# Patient Record
Sex: Female | Born: 1985
Health system: Southern US, Community
[De-identification: ages and names within clinical notes are randomized; demographics above are authoritative.]

## PROBLEM LIST (undated history)

## (undated) DIAGNOSIS — M5136 Other intervertebral disc degeneration, lumbar region: Secondary | ICD-10-CM

## (undated) DIAGNOSIS — F909 Attention-deficit hyperactivity disorder, unspecified type: Secondary | ICD-10-CM

## (undated) DIAGNOSIS — M459 Ankylosing spondylitis of unspecified sites in spine: Secondary | ICD-10-CM

## (undated) DIAGNOSIS — Z720 Tobacco use: Secondary | ICD-10-CM

## (undated) DIAGNOSIS — M51369 Other intervertebral disc degeneration, lumbar region without mention of lumbar back pain or lower extremity pain: Secondary | ICD-10-CM

## (undated) DIAGNOSIS — N390 Urinary tract infection, site not specified: Secondary | ICD-10-CM

## (undated) DIAGNOSIS — M1990 Unspecified osteoarthritis, unspecified site: Secondary | ICD-10-CM

## (undated) DIAGNOSIS — G8929 Other chronic pain: Secondary | ICD-10-CM

## (undated) HISTORY — DX: Tobacco use: Z72.0

## (undated) HISTORY — DX: Ankylosing spondylitis of unspecified sites in spine: M45.9

## (undated) HISTORY — DX: Unspecified osteoarthritis, unspecified site: M19.90

## (undated) HISTORY — DX: Other chronic pain: G89.29

## (undated) HISTORY — DX: Urinary tract infection, site not specified: N39.0

## (undated) HISTORY — PX: RADIOFREQUENCY ABLATION NERVES: SUR1070

## (undated) HISTORY — PX: TONSILLECTOMY: SUR1361

## (undated) HISTORY — DX: Attention-deficit hyperactivity disorder, unspecified type: F90.9

---
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
## 2007-01-12 ENCOUNTER — Encounter: Payer: Self-pay | Admitting: Family Medicine

## 2007-01-12 DIAGNOSIS — G43909 Migraine, unspecified, not intractable, without status migrainosus: Secondary | ICD-10-CM | POA: Insufficient documentation

## 2007-01-12 DIAGNOSIS — F329 Major depressive disorder, single episode, unspecified: Secondary | ICD-10-CM

## 2007-01-12 DIAGNOSIS — F909 Attention-deficit hyperactivity disorder, unspecified type: Secondary | ICD-10-CM | POA: Insufficient documentation

## 2007-01-12 HISTORY — DX: Migraine, unspecified, not intractable, without status migrainosus: G43.909

## 2007-02-03 ENCOUNTER — Encounter (INDEPENDENT_AMBULATORY_CARE_PROVIDER_SITE_OTHER): Payer: Self-pay | Admitting: *Deleted

## 2008-03-12 ENCOUNTER — Observation Stay: Payer: Self-pay | Admitting: Obstetrics and Gynecology

## 2008-03-21 ENCOUNTER — Observation Stay: Payer: Self-pay

## 2008-05-26 ENCOUNTER — Observation Stay: Payer: Self-pay | Admitting: Obstetrics and Gynecology

## 2008-06-02 ENCOUNTER — Inpatient Hospital Stay: Payer: Self-pay

## 2008-10-26 ENCOUNTER — Emergency Department (HOSPITAL_COMMUNITY): Admission: EM | Admit: 2008-10-26 | Discharge: 2008-10-26 | Payer: Self-pay | Admitting: Emergency Medicine

## 2010-05-17 ENCOUNTER — Emergency Department: Payer: Self-pay | Admitting: Emergency Medicine

## 2010-09-24 LAB — URINE MICROSCOPIC-ADD ON

## 2010-09-24 LAB — GC/CHLAMYDIA PROBE AMP, GENITAL
Chlamydia, DNA Probe: NEGATIVE
GC Probe Amp, Genital: NEGATIVE

## 2010-09-24 LAB — URINALYSIS, ROUTINE W REFLEX MICROSCOPIC
Bilirubin Urine: NEGATIVE
Glucose, UA: NEGATIVE mg/dL
Hgb urine dipstick: NEGATIVE
Specific Gravity, Urine: 1.023 (ref 1.005–1.030)
pH: 6.5 (ref 5.0–8.0)

## 2010-09-24 LAB — WET PREP, GENITAL: Yeast Wet Prep HPF POC: NONE SEEN

## 2010-09-24 LAB — RPR: RPR Ser Ql: NONREACTIVE

## 2010-09-24 LAB — HCG, QUANTITATIVE, PREGNANCY: hCG, Beta Chain, Quant, S: <2 m[IU]/mL (ref <5)

## 2015-12-05 ENCOUNTER — Other Ambulatory Visit: Payer: Self-pay | Admitting: Orthopedic Surgery

## 2015-12-05 DIAGNOSIS — M2392 Unspecified internal derangement of left knee: Secondary | ICD-10-CM

## 2015-12-05 DIAGNOSIS — M25562 Pain in left knee: Secondary | ICD-10-CM

## 2015-12-25 ENCOUNTER — Ambulatory Visit
Admission: RE | Admit: 2015-12-25 | Discharge: 2015-12-25 | Disposition: A | Discharge status: Home / Self Care | Source: Ambulatory Visit | Attending: Orthopedic Surgery | Admitting: Orthopedic Surgery

## 2015-12-25 DIAGNOSIS — R937 Abnormal findings on diagnostic imaging of other parts of musculoskeletal system: Secondary | ICD-10-CM | POA: Insufficient documentation

## 2015-12-25 DIAGNOSIS — M25462 Effusion, left knee: Secondary | ICD-10-CM | POA: Insufficient documentation

## 2015-12-25 DIAGNOSIS — M2392 Unspecified internal derangement of left knee: Secondary | ICD-10-CM | POA: Diagnosis present

## 2015-12-25 DIAGNOSIS — M25562 Pain in left knee: Secondary | ICD-10-CM | POA: Insufficient documentation

## 2016-01-10 ENCOUNTER — Emergency Department (HOSPITAL_COMMUNITY)
Admission: EM | Admit: 2016-01-10 | Discharge: 2016-01-10 | Disposition: A | Discharge status: Home / Self Care | Attending: Emergency Medicine | Admitting: Emergency Medicine

## 2016-01-10 ENCOUNTER — Encounter (HOSPITAL_COMMUNITY): Payer: Self-pay | Admitting: Emergency Medicine

## 2016-01-10 ENCOUNTER — Emergency Department (HOSPITAL_COMMUNITY)

## 2016-01-10 DIAGNOSIS — S99911A Unspecified injury of right ankle, initial encounter: Secondary | ICD-10-CM | POA: Diagnosis present

## 2016-01-10 DIAGNOSIS — S62309A Unspecified fracture of unspecified metacarpal bone, initial encounter for closed fracture: Secondary | ICD-10-CM

## 2016-01-10 DIAGNOSIS — Y999 Unspecified external cause status: Secondary | ICD-10-CM | POA: Insufficient documentation

## 2016-01-10 DIAGNOSIS — S62310A Displaced fracture of base of second metacarpal bone, right hand, initial encounter for closed fracture: Secondary | ICD-10-CM | POA: Insufficient documentation

## 2016-01-10 DIAGNOSIS — Y939 Activity, unspecified: Secondary | ICD-10-CM | POA: Insufficient documentation

## 2016-01-10 DIAGNOSIS — Z91040 Latex allergy status: Secondary | ICD-10-CM | POA: Insufficient documentation

## 2016-01-10 DIAGNOSIS — Y92410 Unspecified street and highway as the place of occurrence of the external cause: Secondary | ICD-10-CM | POA: Insufficient documentation

## 2016-01-10 DIAGNOSIS — S8251XA Displaced fracture of medial malleolus of right tibia, initial encounter for closed fracture: Secondary | ICD-10-CM | POA: Diagnosis not present

## 2016-01-10 HISTORY — DX: Other intervertebral disc degeneration, lumbar region: M51.36

## 2016-01-10 HISTORY — DX: Other intervertebral disc degeneration, lumbar region without mention of lumbar back pain or lower extremity pain: M51.369

## 2016-01-10 MED ORDER — MORPHINE SULFATE (PF) 4 MG/ML IV SOLN
8.0000 mg | Freq: Once | INTRAVENOUS | Status: AC
  Administered 2016-01-10: 8 mg via INTRAVENOUS
  Filled 2016-01-10: qty 2

## 2016-01-10 MED ORDER — FREE SPIRIT KNEE/LEG WALKER MISC
1.0000 [IU] | 0 refills | Status: DC | PRN
Start: 2016-01-10 — End: 2019-09-28

## 2016-01-10 MED ORDER — OXYCODONE-ACETAMINOPHEN 5-325 MG PO TABS
2.0000 | ORAL_TABLET | Freq: Once | ORAL | Status: AC
  Administered 2016-01-10: 2 via ORAL
  Filled 2016-01-10: qty 2

## 2016-01-10 MED ORDER — HYDROMORPHONE HCL 1 MG/ML IJ SOLN
1.0000 mg | Freq: Once | INTRAMUSCULAR | Status: AC
  Administered 2016-01-10: 1 mg via INTRAVENOUS
  Filled 2016-01-10: qty 1

## 2016-01-10 MED ORDER — OXYCODONE-ACETAMINOPHEN 5-325 MG PO TABS: 1.0000 | ORAL_TABLET | Freq: Four times a day (QID) | ORAL | 0 refills | Status: DC | PRN

## 2016-01-10 NOTE — ED Notes (Signed)
Patient unable to use bed pain at this time due to pain.

## 2016-01-10 NOTE — Progress Notes (Signed)
Orthopedic Tech Progress Note Patient Details:  Julia Mckay [facility] 08/01/1985 [id number]  Ortho Devices Type of Ortho Device: Ace wrap, Crutches, Arm sling, Stirrup splint, Volar splint, Post (short) splint Splint Material: Fiberglass Ortho Device/Splint Interventions: Application   Saul Fordyce 01/10/2016, 10:15 PM

## 2016-01-10 NOTE — ED Notes (Signed)
RN called ortho tech to apply splints
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
## 2016-01-10 NOTE — ED Notes (Signed)
Ortho tech at bedside 

## 2016-01-10 NOTE — ED Notes (Addendum)
Patient has what looks like a softball size bruise below right deltoid that is painful. Patient "needs to use restroom but unable to until she gets more pain medication."

## 2016-01-11 ENCOUNTER — Ambulatory Visit
Admission: RE | Admit: 2016-01-11 | Discharge: 2016-01-11 | Disposition: A | Discharge status: Home / Self Care | Source: Ambulatory Visit | Attending: Surgery | Admitting: Surgery

## 2016-01-11 ENCOUNTER — Other Ambulatory Visit: Payer: Self-pay | Admitting: Surgery

## 2016-01-11 DIAGNOSIS — X58XXXA Exposure to other specified factors, initial encounter: Secondary | ICD-10-CM | POA: Insufficient documentation

## 2016-01-11 DIAGNOSIS — S62311A Displaced fracture of base of second metacarpal bone. left hand, initial encounter for closed fracture: Secondary | ICD-10-CM | POA: Insufficient documentation

## 2016-01-11 DIAGNOSIS — S62319A Displaced fracture of base of unspecified metacarpal bone, initial encounter for closed fracture: Secondary | ICD-10-CM | POA: Insufficient documentation

## 2016-01-14 ENCOUNTER — Encounter: Payer: Self-pay | Admitting: *Deleted

## 2016-01-16 ENCOUNTER — Encounter
Admission: RE | Disposition: A | Discharge status: Home / Self Care | Payer: Self-pay | Source: Ambulatory Visit | Attending: Surgery

## 2016-01-16 ENCOUNTER — Ambulatory Visit: Admitting: Anesthesiology

## 2016-01-16 ENCOUNTER — Ambulatory Visit
Admission: RE | Admit: 2016-01-16 | Discharge: 2016-01-16 | Disposition: A | Discharge status: Home / Self Care | Source: Ambulatory Visit | Attending: Surgery | Admitting: Surgery

## 2016-01-16 DIAGNOSIS — F988 Other specified behavioral and emotional disorders with onset usually occurring in childhood and adolescence: Secondary | ICD-10-CM | POA: Diagnosis not present

## 2016-01-16 DIAGNOSIS — R51 Headache: Secondary | ICD-10-CM | POA: Diagnosis not present

## 2016-01-16 DIAGNOSIS — Z888 Allergy status to other drugs, medicaments and biological substances status: Secondary | ICD-10-CM | POA: Insufficient documentation

## 2016-01-16 DIAGNOSIS — F329 Major depressive disorder, single episode, unspecified: Secondary | ICD-10-CM | POA: Diagnosis not present

## 2016-01-16 DIAGNOSIS — S8251XA Displaced fracture of medial malleolus of right tibia, initial encounter for closed fracture: Secondary | ICD-10-CM | POA: Diagnosis present

## 2016-01-16 DIAGNOSIS — Z79899 Other long term (current) drug therapy: Secondary | ICD-10-CM | POA: Diagnosis not present

## 2016-01-16 DIAGNOSIS — S62314A Displaced fracture of base of fourth metacarpal bone, right hand, initial encounter for closed fracture: Secondary | ICD-10-CM | POA: Insufficient documentation

## 2016-01-16 DIAGNOSIS — Z91040 Latex allergy status: Secondary | ICD-10-CM | POA: Diagnosis not present

## 2016-01-16 DIAGNOSIS — S62310A Displaced fracture of base of second metacarpal bone, right hand, initial encounter for closed fracture: Secondary | ICD-10-CM | POA: Diagnosis not present

## 2016-01-16 DIAGNOSIS — M1990 Unspecified osteoarthritis, unspecified site: Secondary | ICD-10-CM | POA: Diagnosis not present

## 2016-01-16 DIAGNOSIS — Y939 Activity, unspecified: Secondary | ICD-10-CM | POA: Diagnosis not present

## 2016-01-16 HISTORY — PX: ORIF ANKLE FRACTURE: SHX5408

## 2016-01-16 HISTORY — PX: CLOSED REDUCTION METACARPAL WITH PERCUTANEOUS PINNING: SHX5613

## 2016-01-16 SURGERY — CLOSED REDUCTION, FRACTURE, METACARPAL BONE, WITH PERCUTANEOUS PINNING
Anesthesia: General | Site: Ankle | Laterality: Right | Wound class: Clean

## 2016-01-16 MED ORDER — HYDROCODONE-ACETAMINOPHEN 5-325 MG PO TABS: 1.0000 | ORAL_TABLET | ORAL | Status: DC | PRN

## 2016-01-16 MED ORDER — PROMETHAZINE HCL 25 MG/ML IJ SOLN: 6.2500 mg | INTRAMUSCULAR | Status: DC | PRN

## 2016-01-16 MED ORDER — LIDOCAINE HCL (CARDIAC) 20 MG/ML IV SOLN
INTRAVENOUS | Status: DC | PRN
  Administered 2016-01-16: 50 mg via INTRATRACHEAL

## 2016-01-16 MED ORDER — ROPIVACAINE HCL 5 MG/ML IJ SOLN
INTRAMUSCULAR | Status: DC | PRN
  Administered 2016-01-16: 100 mg via PERINEURAL

## 2016-01-16 MED ORDER — POTASSIUM CHLORIDE IN NACL 20-0.9 MEQ/L-% IV SOLN
INTRAVENOUS | Status: DC
Start: 2016-01-16 — End: 2016-01-16

## 2016-01-16 MED ORDER — ACETAMINOPHEN 10 MG/ML IV SOLN
INTRAVENOUS | Status: DC | PRN
  Administered 2016-01-16: 1000 mg via INTRAVENOUS

## 2016-01-16 MED ORDER — OXYCODONE HCL 5 MG PO TABS
5.0000 mg | ORAL_TABLET | Freq: Once | ORAL | Status: AC | PRN
  Administered 2016-01-16: 5 mg via ORAL

## 2016-01-16 MED ORDER — METOCLOPRAMIDE HCL 5 MG/ML IJ SOLN: 5.0000 mg | Freq: Three times a day (TID) | INTRAMUSCULAR | Status: DC | PRN

## 2016-01-16 MED ORDER — OXYCODONE HCL 5 MG/5ML PO SOLN: 5.0000 mg | Freq: Once | ORAL | Status: AC | PRN

## 2016-01-16 MED ORDER — CEFAZOLIN SODIUM-DEXTROSE 2-4 GM/100ML-% IV SOLN
2.0000 g | Freq: Once | INTRAVENOUS | Status: AC
  Administered 2016-01-16: 2 g via INTRAVENOUS

## 2016-01-16 MED ORDER — KETOROLAC TROMETHAMINE 30 MG/ML IJ SOLN
30.0000 mg | Freq: Once | INTRAMUSCULAR | Status: AC
  Administered 2016-01-16: 30 mg via INTRAVENOUS

## 2016-01-16 MED ORDER — ONDANSETRON HCL 4 MG PO TABS: 4.0000 mg | ORAL_TABLET | Freq: Four times a day (QID) | ORAL | Status: DC | PRN

## 2016-01-16 MED ORDER — BUPIVACAINE-EPINEPHRINE (PF) 0.5% -1:200000 IJ SOLN
INTRAMUSCULAR | Status: DC | PRN
  Administered 2016-01-16: 10 mL via PERINEURAL

## 2016-01-16 MED ORDER — DEXAMETHASONE SODIUM PHOSPHATE 4 MG/ML IJ SOLN
INTRAMUSCULAR | Status: DC | PRN
  Administered 2016-01-16: 4 mg via INTRAVENOUS

## 2016-01-16 MED ORDER — METOCLOPRAMIDE HCL 5 MG PO TABS: 5.0000 mg | ORAL_TABLET | Freq: Three times a day (TID) | ORAL | Status: DC | PRN

## 2016-01-16 MED ORDER — GLYCOPYRROLATE 0.2 MG/ML IJ SOLN
INTRAMUSCULAR | Status: DC | PRN
  Administered 2016-01-16: 0.1 mg via INTRAVENOUS

## 2016-01-16 MED ORDER — ONDANSETRON HCL 4 MG/2ML IJ SOLN: 4.0000 mg | Freq: Four times a day (QID) | INTRAMUSCULAR | Status: DC | PRN

## 2016-01-16 MED ORDER — HYDROCODONE-ACETAMINOPHEN 5-325 MG PO TABS: 1.0000 | ORAL_TABLET | Freq: Four times a day (QID) | ORAL | 0 refills | Status: DC | PRN

## 2016-01-16 MED ORDER — LACTATED RINGERS IV SOLN
INTRAVENOUS | Status: DC
  Administered 2016-01-16 (×2): via INTRAVENOUS

## 2016-01-16 MED ORDER — FENTANYL CITRATE (PF) 100 MCG/2ML IJ SOLN
50.0000 ug | INTRAMUSCULAR | Status: AC | PRN
  Administered 2016-01-16 (×2): 50 ug via INTRAVENOUS
  Administered 2016-01-16: 25 ug via INTRAVENOUS
  Administered 2016-01-16: 50 ug via INTRAVENOUS
  Administered 2016-01-16 (×5): 25 ug via INTRAVENOUS
  Administered 2016-01-16 (×2): 50 ug via INTRAVENOUS

## 2016-01-16 MED ORDER — ONDANSETRON HCL 4 MG/2ML IJ SOLN
INTRAMUSCULAR | Status: DC | PRN
  Administered 2016-01-16: 4 mg via INTRAVENOUS

## 2016-01-16 MED ORDER — PROPOFOL 10 MG/ML IV BOLUS
INTRAVENOUS | Status: DC | PRN
  Administered 2016-01-16: 200 mg via INTRAVENOUS

## 2016-01-16 MED ORDER — ONDANSETRON 4 MG PO TBDP: 4.0000 mg | ORAL_TABLET | Freq: Three times a day (TID) | ORAL | 1 refills | Status: DC | PRN

## 2016-01-16 MED ORDER — MIDAZOLAM HCL 5 MG/5ML IJ SOLN
INTRAMUSCULAR | Status: DC | PRN
  Administered 2016-01-16: 2 mg via INTRAVENOUS

## 2016-01-16 MED ORDER — HYDROMORPHONE HCL 1 MG/ML IJ SOLN
0.2500 mg | INTRAMUSCULAR | Status: DC | PRN
  Administered 2016-01-16 (×2): 0.5 mg via INTRAVENOUS

## 2016-01-16 MED ORDER — HYDROMORPHONE HCL 1 MG/ML IJ SOLN
INTRAMUSCULAR | Status: DC | PRN
  Administered 2016-01-16 (×2): 0.5 mg via INTRAVENOUS

## 2016-01-16 SURGICAL SUPPLY — 57 items
BANDAGE ACE 4X5 VEL STRL LF (GAUZE/BANDAGES/DRESSINGS) ×8 IMPLANT
BANDAGE ACE 6X5 VEL STRL LF (GAUZE/BANDAGES/DRESSINGS) ×4 IMPLANT
BLADE SURG 15 STRL LF DISP TIS (BLADE) ×2 IMPLANT
BLADE SURG 15 STRL SS (BLADE) ×4
BLADE SURG SZ10 CARB STEEL (BLADE) ×8 IMPLANT
BNDG COHESIVE 4X5 TAN STRL (GAUZE/BANDAGES/DRESSINGS) ×4 IMPLANT
BNDG ESMARK 6X12 TAN STRL LF (GAUZE/BANDAGES/DRESSINGS) ×4 IMPLANT
BNDG PLASTER FAST 4X5 WHT LF (CAST SUPPLIES) ×16 IMPLANT
BNDG PLSTR 5X4 FST ST WHT LF (CAST SUPPLIES) ×8
CANISTER SUCT 1200ML W/VALVE (MISCELLANEOUS) ×4 IMPLANT
CHLORAPREP W/TINT 26ML (MISCELLANEOUS) ×8 IMPLANT
COVER LIGHT HANDLE UNIVERSAL (MISCELLANEOUS) ×8 IMPLANT
COVER PIN YLW 0.028-062 (MISCELLANEOUS) ×6 IMPLANT
CUFF TOURN SGL QUICK 18 (TOURNIQUET CUFF) ×2 IMPLANT
CUFF TOURN SGL QUICK 24 (TOURNIQUET CUFF) ×4
CUFF TOURN SGL QUICK 34 (TOURNIQUET CUFF)
CUFF TRNQT CYL 24X4X40X1 (TOURNIQUET CUFF) IMPLANT
CUFF TRNQT CYL 34X4X40X1 (TOURNIQUET CUFF) IMPLANT
DRAPE C-ARM XRAY 36X54 (DRAPES) ×4 IMPLANT
DRAPE INCISE IOBAN 66X45 STRL (DRAPES) ×4 IMPLANT
DRAPE SURG 17X11 SM STRL (DRAPES) ×4 IMPLANT
DRAPE U-SHAPE 48X52 POLY STRL (PACKS) ×4 IMPLANT
ELECT CAUTERY BLADE TIP 2.5 (TIP)
ELECT REM PT RETURN 9FT ADLT (ELECTROSURGICAL) ×4
ELECTRODE CAUTERY BLDE TIP 2.5 (TIP) ×2 IMPLANT
ELECTRODE REM PT RTRN 9FT ADLT (ELECTROSURGICAL) ×2 IMPLANT
GAUZE PETRO XEROFOAM 1X8 (MISCELLANEOUS) ×4 IMPLANT
GAUZE SPONGE 4X4 12PLY STRL (GAUZE/BANDAGES/DRESSINGS) ×4 IMPLANT
GLOVE PI ULTRA LF STRL 7.5 (GLOVE) IMPLANT
GLOVE PI ULTRA NON LATEX 7.5 (GLOVE) ×12
GOWN STRL REUS W/ TWL LRG LVL3 (GOWN DISPOSABLE) ×2 IMPLANT
GOWN STRL REUS W/ TWL XL LVL3 (GOWN DISPOSABLE) ×2 IMPLANT
GOWN STRL REUS W/TWL LRG LVL3 (GOWN DISPOSABLE) ×8
GOWN STRL REUS W/TWL XL LVL3 (GOWN DISPOSABLE) ×4
K-WIRE ACE 1.6X6 (WIRE) ×12
KIT ROOM TURNOVER OR (KITS) ×4 IMPLANT
KWIRE ACE 1.6X6 (WIRE) IMPLANT
NDL HYPO 21X1.5 SAFETY (NEEDLE) ×2 IMPLANT
NEEDLE HYPO 21X1.5 SAFETY (NEEDLE) ×4 IMPLANT
NS IRRIG 1000ML POUR BTL (IV SOLUTION) ×4 IMPLANT
PACK EXTREMITY ARMC (MISCELLANEOUS) ×4 IMPLANT
PAD ABD DERMACEA PRESS 5X9 (GAUZE/BANDAGES/DRESSINGS) ×10 IMPLANT
PAD CAST CTTN 4X4 STRL (SOFTGOODS) ×4 IMPLANT
PADDING CAST 4IN STRL (MISCELLANEOUS) ×6
PADDING CAST BLEND 4X4 STRL (MISCELLANEOUS) IMPLANT
PADDING CAST COTTON 4X4 STRL (SOFTGOODS) ×8
SCREW ACE CAN 4.0 46M (Screw) ×4 IMPLANT
SPONGE LAP 18X18 5 PK (GAUZE/BANDAGES/DRESSINGS) ×4 IMPLANT
STAPLER SKIN PROX 35W (STAPLE) ×4 IMPLANT
STOCKINETTE IMPERVIOUS LG (DRAPES) ×4 IMPLANT
STRAP BODY AND KNEE 60X3 (MISCELLANEOUS) ×4 IMPLANT
SUT VIC AB 0 CT1 36 (SUTURE) ×4 IMPLANT
SUT VIC AB 2-0 CT1 27 (SUTURE) ×8
SUT VIC AB 2-0 CT1 TAPERPNT 27 (SUTURE) IMPLANT
SUT VIC AB 2-0 SH 27 (SUTURE) ×8
SUT VIC AB 2-0 SH 27XBRD (SUTURE) ×4 IMPLANT
k-wire (Wire) ×8 IMPLANT

## 2016-01-16 NOTE — Transfer of Care (Signed)
Immediate Anesthesia Transfer of Care Note  Patient: Julia Mckay  Procedure(s) Performed: Procedure(s) with comments: CLOSED REDUCTION METACARPAL WITH PERCUTANEOUS PINNING 1 OR 2 METACARPAL FRACTURES OF RIGHT HAND (Right) OPEN REDUCTION INTERNAL FIXATION (ORIF) ANKLE FRACTURE (Right) - Latex allergy  Patient Location: PACU  Anesthesia Type: General LMA  Level of Consciousness: awake, alert  and patient cooperative  Airway and Oxygen Therapy: Patient Spontanous Breathing and Patient connected to supplemental oxygen  Post-op Assessment: Post-op Vital signs reviewed, Patient's Cardiovascular Status Stable, Respiratory Function Stable, Patent Airway and No signs of Nausea or vomiting  Post-op Vital Signs: Reviewed and stable  Complications: No apparent anesthesia complications

## 2016-01-16 NOTE — Progress Notes (Signed)
Assisted [place] ANMD with right, interscalene  block. Side rails up, monitors on throughout procedure. See vital signs in flow sheet. Tolerated Procedure well.   
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
## 2016-01-16 NOTE — H&P (Signed)
Paper H&P to be scanned into permanent record. H&P reviewed. No changes. 

## 2016-01-16 NOTE — Discharge Instructions (Signed)
General Anesthesia, Adult, Care After Refer to this sheet in the next few weeks. These instructions provide you with information on caring for yourself after your procedure. Your health care provider may also give you more specific instructions. Your treatment has been planned according to current medical practices, but problems sometimes occur. Call your health care provider if you have any problems or questions after your procedure. WHAT TO EXPECT AFTER THE PROCEDURE After the procedure, it is typical to experience:  Sleepiness.  Nausea and vomiting. HOME CARE INSTRUCTIONS  For the first 24 hours after general anesthesia:  Have a responsible person with you.  Do not drive a car. If you are alone, do not take public transportation.  Do not drink alcohol.  Do not take medicine that has not been prescribed by your health care provider.  Do not sign important papers or make important decisions.  You may resume a normal diet and activities as directed by your health care provider.  Change bandages (dressings) as directed.  If you have questions or problems that seem related to general anesthesia, call the hospital and ask for the anesthetist or anesthesiologist on call. SEEK MEDICAL CARE IF:  You have nausea and vomiting that continue the day after anesthesia.  You develop a rash. SEEK IMMEDIATE MEDICAL CARE IF:   You have difficulty breathing.  You have chest pain.  You have any allergic problems.   This information is not intended to replace advice given to you by your health care provider. Make sure you discuss any questions you have with your health care provider.   Document Released: 09/08/2000 Document Revised: 06/23/2014 Document Reviewed: 10/01/2011 Elsevier Interactive Patient Education 2016 [employer].  Keep splints dry and intact. Keep right hand and right foot elevated above heart level. Apply ice to affected areas frequently. Return for follow-up in 10-14  days or as scheduled.

## 2016-01-16 NOTE — Anesthesia Postprocedure Evaluation (Signed)
Anesthesia Post Note  Patient: Julia Mckay  Procedure(s) Performed: Procedure(s) (LRB): CLOSED REDUCTION METACARPAL WITH PERCUTANEOUS PINNING 1 and  2 METACARPAL FRACTURES OF RIGHT HAND (Right) OPEN REDUCTION INTERNAL FIXATION (ORIF) ANKLE FRACTURE (Right)  Patient location during evaluation: PACU Anesthesia Type: General and Regional Level of consciousness: awake and alert Pain management: pain level controlled Vital Signs Assessment: post-procedure vital signs reviewed and stable Respiratory status: spontaneous breathing, nonlabored ventilation, respiratory function stable and patient connected to nasal cannula oxygen Cardiovascular status: blood pressure returned to baseline and stable Postop Assessment: no signs of nausea or vomiting Anesthetic complications: no Comments:      Scarlette Slice

## 2016-01-16 NOTE — Anesthesia Procedure Notes (Signed)
Anesthesia Regional Block:  Supraclavicular block  Pre-Anesthetic Checklist: ,, timeout performed, Correct Patient, Correct Site, Correct Laterality, Correct Procedure, Correct Position, site marked, Risks and benefits discussed,  Surgical consent,  Pre-op evaluation,  At surgeon's request and post-op pain management  Laterality: Right  Prep: chloraprep       Needles:  Injection technique: Single-shot  Needle Type: Echogenic Stimulator Needle      Needle Gauge: 21 and 21 G    Additional Needles:  Procedures: ultrasound guided (picture in chart) and nerve stimulator Supraclavicular block  Nerve Stimulator or Paresthesia:  Response: bicep contraction, 0.45 mA,   Additional Responses:   Narrative:  Injection made incrementally with aspirations every 5 mL.  Performed by: Personally   Additional Notes: Functioning IV was confirmed and monitors applied.  Sterile prep and drape,hand hygiene and sterile gloves were used.Ultrasound guidance: relevant anatomy identified, needle position confirmed, local anesthetic spread visualized around nerve(s)., vascular puncture avoided.  Image printed for medical record.  Negative aspiration and negative test dose prior to incremental administration of local anesthetic. The patient tolerated the procedure well. Vitals signes recorded in RN notes.      

## 2016-01-16 NOTE — Anesthesia Procedure Notes (Signed)
Procedure Name: LMA Insertion Date/Time: 01/16/2016 10:34 AM Performed by: Orlin Hilding Pre-anesthesia Checklist: Patient identified, Emergency Drugs available, Suction available, Timeout performed and Patient being monitored Patient Re-evaluated:Patient Re-evaluated prior to inductionOxygen Delivery Method: Circle system utilized Preoxygenation: Pre-oxygenation with 100% oxygen Intubation Type: IV induction LMA: LMA inserted LMA Size: 4.0 Number of attempts: 1 Placement Confirmation: positive ETCO2 and breath sounds checked- equal and bilateral Tube secured with: Tape

## 2016-01-16 NOTE — Anesthesia Preprocedure Evaluation (Addendum)
Anesthesia Evaluation  Patient identified by MRN, date of birth, ID band Patient awake    Reviewed: Allergy & Precautions, H&P , NPO status , Patient's Chart, lab work & pertinent test results, reviewed documented beta blocker date and time   Airway Mallampati: II  TM Distance: >3 FB Neck ROM: full    Dental no notable dental hx.    Pulmonary neg pulmonary ROS, former smoker,    Pulmonary exam normal breath sounds clear to auscultation       Cardiovascular Exercise Tolerance: Good negative cardio ROS   Rhythm:regular Rate:Normal     Neuro/Psych  Headaches, PSYCHIATRIC DISORDERS (depression, ADD)    GI/Hepatic negative GI ROS, Neg liver ROS,   Endo/Other  negative endocrine ROS  Renal/GU negative Renal ROS  negative genitourinary   Musculoskeletal  (+) Arthritis ,   Abdominal   Peds  Hematology negative hematology ROS (+)   Anesthesia Other Findings   Reproductive/Obstetrics negative OB ROS                            Anesthesia Physical Anesthesia Plan  ASA: II  Anesthesia Plan: General LMA   Post-op Pain Management:    Induction:   Airway Management Planned:   Additional Equipment:   Intra-op Plan:   Post-operative Plan:   Informed Consent: I have reviewed the patients History and Physical, chart, labs and discussed the procedure including the risks, benefits and alternatives for the proposed anesthesia with the patient or authorized representative who has indicated his/her understanding and acceptance.   Dental Advisory Given  Plan Discussed with: CRNA  Anesthesia Plan Comments: (Reviewed possibility of post-op regional block if pain not responsive to IV/PO analgesics.)       Anesthesia Quick Evaluation

## 2016-01-17 ENCOUNTER — Encounter: Payer: Self-pay | Admitting: Surgery
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
## 2019-05-25 ENCOUNTER — Other Ambulatory Visit: Payer: Self-pay

## 2019-05-25 DIAGNOSIS — Z20822 Contact with and (suspected) exposure to covid-19: Secondary | ICD-10-CM

## 2019-05-26 LAB — NOVEL CORONAVIRUS, NAA: SARS-CoV-2, NAA: NOT DETECTED

## 2019-09-28 ENCOUNTER — Other Ambulatory Visit: Payer: Self-pay

## 2019-09-28 ENCOUNTER — Ambulatory Visit (INDEPENDENT_AMBULATORY_CARE_PROVIDER_SITE_OTHER): Payer: 59 | Admitting: Primary Care

## 2019-09-28 ENCOUNTER — Encounter: Payer: Self-pay | Admitting: Primary Care

## 2019-09-28 VITALS — BP 136/86 | HR 88 | Temp 97.0°F | Ht 66.0 in | Wt 214.0 lb

## 2019-09-28 DIAGNOSIS — M79673 Pain in unspecified foot: Secondary | ICD-10-CM

## 2019-09-28 DIAGNOSIS — Z720 Tobacco use: Secondary | ICD-10-CM | POA: Diagnosis not present

## 2019-09-28 DIAGNOSIS — M5440 Lumbago with sciatica, unspecified side: Secondary | ICD-10-CM

## 2019-09-28 DIAGNOSIS — G8929 Other chronic pain: Secondary | ICD-10-CM

## 2019-09-28 DIAGNOSIS — F909 Attention-deficit hyperactivity disorder, unspecified type: Secondary | ICD-10-CM

## 2019-09-28 DIAGNOSIS — M2550 Pain in unspecified joint: Secondary | ICD-10-CM

## 2019-09-28 DIAGNOSIS — Z8249 Family history of ischemic heart disease and other diseases of the circulatory system: Secondary | ICD-10-CM

## 2019-09-28 MED ORDER — BUPROPION HCL ER (SR) 100 MG PO TB12: 100.0000 mg | ORAL_TABLET | Freq: Two times a day (BID) | ORAL | 0 refills | Status: DC

## 2019-09-28 NOTE — Assessment & Plan Note (Signed)
Evaluated by rheumatology, tested negative for rheumatoid arthritis/autoimmune disorder.

## 2019-09-28 NOTE — Assessment & Plan Note (Signed)
Managed on Adderall in [place], has been off for quite some time. Discussed that she will need psychiatry evaluation for testing and treatment. We will discuss at another time.

## 2019-09-28 NOTE — Assessment & Plan Note (Signed)
In father with early death at 89 from MI. Encouraged weight loss, tobacco cessation.  Will address again at further visits.

## 2019-09-28 NOTE — Assessment & Plan Note (Signed)
Chronic to bilateral feet, follows with podiatry.

## 2019-09-28 NOTE — Patient Instructions (Addendum)
You will be contacted regarding your referral to physical therapy.  Please let us know if you have not been contacted within two weeks.   Start bupropion SR 100 mg tablets for smoking. Start by taking 1 tablet by mouth once daily for 3-5 days, then increase to 1 tablet twice daily thereafter.  Choose a quit date prior to starting the medication. The quit date should be within the first 1-2 weeks.  Please update me in three weeks.   [facility] [employer] [facility] [place]  It was a pleasure to meet you today! Please don't hesitate to call or message me with any questions. Welcome to [facility]!

## 2019-09-28 NOTE — Progress Notes (Signed)
Subjective:    Patient ID: Julia Mckay, female    DOB: 06-01-1986, 34 y.o.   MRN: [id number]  HPI  This visit occurred during the SARS-CoV-2 public health emergency.  Safety protocols were in place, including screening questions prior to the visit, additional usage of staff PPE, and extensive cleaning of exam room while observing appropriate contact time as indicated for disinfecting solutions.   Julia Mckay is a 34 year old female who presents today to establish care and discuss the problems mentioned below. Will obtain/review records.  1) Rheumatoid Arthritis: Diagnosed years ago in [place], positive labs for RA at the time. Symptoms included chronic hand pain and mild deformity to fingers.   She has been following with rheumatology through [facility] recently, has undergone xrays of SI joint/hands and blood work. All labs and xrays were negative and not suggestive of autoimmune disease. Weight loss was recommended.   2) Chronic Foot Pain: Following with podiatry for chronic foot pain which is located to bilateral feet, mostly to left dorsal foot and to the toes when flexing or extending. She is wearing [facility] and pain has improved slightly. She is also managed on Melxoicam, has recently stopped. Due for follow up soon.   3) Chronic Back Pain: Chronic to midline back pain for years. She underwent MRI of the lumbar spine five years ago in [place] and was diagnosed with lumbar DDD and "slippped discs". She underwent nerve ablations around the same time.   She is currently taking 1000 mg of Ibuprofen twice daily and 15000 mg of Tylenol twice daily everyday, some improvement with pain but not much.. She is also managed on Meloxicam 15 mg for her chronic foot pain, does not take this with Ibuprofen. Once managed on narcotics and had a dependence, able to wean off.   Her back pain has become very bothersome at night, has trouble falling and staying asleep. Cannot get comfortable at  night. She was once on Ambien when she lived in [place], did well. She's also tried Trazodone which wasn't effective, also on amitriptyline in the past, not sure why she came off.    About one year ago she began having intermittent left lower back pain with radiation down her left lower extremity. She denies loss of bowel/bladder control. She's never been through physical therapy. She is working on weight loss.   4) Tobacco Abuse: Smoker of cigarettes for the last 15 years, is currently smoking 10 cigarettes daily. She's tried Vaping, nicotine gum, nicotine patches which were ineffective. She has a strong family history of heart disease, early death in her father at the age of 49 from a heart attack.  Review of Systems  Respiratory: Negative for shortness of breath.   Cardiovascular: Negative for chest pain.  Musculoskeletal: Positive for arthralgias. Negative for joint swelling.  Neurological: Positive for numbness. Negative for weakness.       Past Medical History:  Diagnosis Date  . ADHD   . Ankylosing spondylitis ([facility])   . Arthritis   . Chronic foot pain   . DDD (degenerative disc disease), lumbar   . Tobacco abuse   . UTI (urinary tract infection)      Social History   Socioeconomic History  . Marital status: Legally Separated    Spouse name: Not on file  . Number of children: Not on file  . Years of education: Not on file  . Highest education level: Not on file  Occupational History  . Not  on file  Tobacco Use  . Smoking status: Former Smoker    Packs/day: 1.00    Years: 13.00    Pack years: 13.00    Quit date: 05/17/2015    Years since quitting: 4.3  . Smokeless tobacco: Never Used  Substance and Sexual Activity  . Alcohol use: No  . Drug use: No  . Sexual activity: Not on file  Other Topics Concern  . Not on file  Social History Narrative  . Not on file   Social Determinants of Health   Financial Resource Strain:   . Difficulty of Paying Living Expenses:     Food Insecurity:   . Worried About Programme researcher, broadcasting/film/video in the Last Year:   . Barista in the Last Year:   Transportation Needs:   . Freight forwarder (Medical):   Marland Kitchen Lack of Transportation (Non-Medical):   Physical Activity:   . Days of Exercise per Week:   . Minutes of Exercise per Session:   Stress:   . Feeling of Stress :   Social Connections:   . Frequency of Communication with Friends and Family:   . Frequency of Social Gatherings with Friends and Family:   . Attends Religious Services:   . Active Member of Clubs or Organizations:   . Attends Banker Meetings:   Marland Kitchen Marital Status:   Intimate Partner Violence:   . Fear of Current or Ex-Partner:   . Emotionally Abused:   Marland Kitchen Physically Abused:   . Sexually Abused:     Past Surgical History:  Procedure Laterality Date  . CLOSED REDUCTION METACARPAL WITH PERCUTANEOUS PINNING Right 01/16/2016   Procedure: CLOSED REDUCTION METACARPAL WITH PERCUTANEOUS PINNING 1 and  2 METACARPAL FRACTURES OF RIGHT HAND;  Surgeon: Christena Flake, MD;  Location: [facility];  Service: Orthopedics;  Laterality: Right;  . ORIF ANKLE FRACTURE Right 01/16/2016   Procedure: OPEN REDUCTION INTERNAL FIXATION (ORIF) ANKLE FRACTURE;  Surgeon: Christena Flake, MD;  Location: [facility];  Service: Orthopedics;  Laterality: Right;  Latex allergy  . RADIOFREQUENCY ABLATION NERVES     lumbar region of back  . TONSILLECTOMY      Family History  Problem Relation Age of Onset  . Diabetes Mother   . Hypertension Mother   . Heart attack Father   . Early death Father        107  . Hypertension Father     Allergies  Allergen Reactions  . Drospirenone-Ethinyl Estradiol     REACTION: moodiness  . Latex Itching    Current Outpatient Medications on File Prior to Visit  Medication Sig Dispense Refill  . meloxicam (MOBIC) 15 MG tablet Take 15 mg by mouth daily.     No current facility-administered medications on file prior to  visit.    BP 136/86   Pulse 88   Temp (!) 97 F (36.1 C) (Temporal)   Ht 5\' 6"  (1.676 m)   Wt 214 lb (97.1 kg)   SpO2 98%   BMI 34.54 kg/m    Objective:   Physical Exam  Constitutional: She appears well-nourished.  Cardiovascular: Normal rate and regular rhythm.  Respiratory: Effort normal and breath sounds normal.  Musculoskeletal:     Cervical back: Neck supple.  Skin: Skin is warm and dry.  Psychiatric: She has a normal mood and affect.           Assessment & Plan:

## 2019-09-28 NOTE — Assessment & Plan Note (Signed)
Ready to quit, smoking for the last 15 years. Failed OTC treatment.  Discussed options for treatment, will start with low dose Wellbutrin SR. Discussed to choose quit date prior to starting, discussed side effects.   She will update in 3 weeks.

## 2019-09-28 NOTE — Assessment & Plan Note (Signed)
Chronic for years, once on narcotics.  It is quite surprising that she has not undergone physical therapy for chronic back pain, recommend we start here for treatment.  Referral placed. She will update. Consider spine center if no improvement.

## 2019-10-25 ENCOUNTER — Other Ambulatory Visit: Payer: Self-pay | Admitting: Primary Care

## 2019-10-25 DIAGNOSIS — Z720 Tobacco use: Secondary | ICD-10-CM

## 2019-12-01 ENCOUNTER — Other Ambulatory Visit: Payer: Self-pay | Admitting: Primary Care

## 2019-12-01 DIAGNOSIS — Z720 Tobacco use: Secondary | ICD-10-CM

## 2019-12-02 NOTE — Telephone Encounter (Signed)
Last prescribed on 10/27/2019 Last OV (Establish Care) with Mayra Reel on 09/28/2019 No future OV scheduled

## 2019-12-02 NOTE — Telephone Encounter (Signed)
How is she doing on the bupropion for tobacco use?  Is it helping?

## 2019-12-02 NOTE — Telephone Encounter (Signed)
Message left for patient to return my call.  

## 2019-12-07 NOTE — Telephone Encounter (Signed)
Noted, refill sent to pharmacy. 

## 2019-12-07 NOTE — Telephone Encounter (Signed)
Called patient and she stated that it is helping, smoking less and refill please. Then patient hung of the phone to get more detail.

## 2019-12-09 ENCOUNTER — Ambulatory Visit (HOSPITAL_COMMUNITY)
Admission: EM | Admit: 2019-12-09 | Discharge: 2019-12-09 | Disposition: A | Discharge status: Home / Self Care | Payer: 59

## 2019-12-09 ENCOUNTER — Encounter (HOSPITAL_COMMUNITY): Payer: Self-pay

## 2019-12-09 ENCOUNTER — Other Ambulatory Visit: Payer: Self-pay

## 2019-12-09 DIAGNOSIS — B029 Zoster without complications: Secondary | ICD-10-CM | POA: Diagnosis not present

## 2019-12-09 MED ORDER — TRIAMCINOLONE ACETONIDE 0.5 % EX OINT: 1.0000 "application " | TOPICAL_OINTMENT | Freq: Two times a day (BID) | CUTANEOUS | 0 refills | Status: DC

## 2019-12-09 MED ORDER — HYDROXYZINE HCL 25 MG PO TABS
25.0000 mg | ORAL_TABLET | Freq: Four times a day (QID) | ORAL | 0 refills | Status: DC
Start: 2019-12-09 — End: 2020-02-02

## 2019-12-09 MED ORDER — VALACYCLOVIR HCL 1 G PO TABS
1000.0000 mg | ORAL_TABLET | Freq: Three times a day (TID) | ORAL | 0 refills | Status: DC
Start: 2019-12-09 — End: 2020-02-02

## 2019-12-09 NOTE — ED Provider Notes (Signed)
[facility]    CSN: [id number] Arrival date & time: 12/09/19  1736      History   Chief Complaint Chief Complaint  Patient presents with  . Rash    HPI Julia Mckay is a 34 y.o. female with history of left ear, arthritis, herpes zoster, migraines, tobacco use presenting for painful rash of her left arm.  Patient states she had a tattoo done 2 weeks ago: Recovered well.  No arthralgias, myalgias, fever, deformity of type II.  Patient states it does feel similar to when she had shingles, though is in different distribution of same arm.  (Was axillary previously).   Past Medical History:  Diagnosis Date  . ADHD   . Ankylosing spondylitis (HCC)   . Arthritis   . Chronic foot pain   . DDD (degenerative disc disease), lumbar   . HERPES ZOSTER, UNCOMPLICATED 11/10/2006   Qualifier: Diagnosis of  By: Ermalene Searing MD, Amy    . MIGRAINE HEADACHE 01/12/2007   Qualifier: History of  By: Lindwood Qua CMA, Jerl Santos    . Tobacco abuse   . UTI (urinary tract infection)     Patient Active Problem List   Diagnosis Date Noted  . Chronic midline low back pain with sciatica 09/28/2019  . Tobacco abuse 09/28/2019  . Chronic foot pain 09/28/2019  . Family history of early CAD 09/28/2019  . Arthralgia of multiple sites, bilateral 09/28/2019  . DEPRESSION 01/12/2007  . Attention deficit hyperactivity disorder (ADHD) 01/12/2007    Past Surgical History:  Procedure Laterality Date  . CLOSED REDUCTION METACARPAL WITH PERCUTANEOUS PINNING Right 01/16/2016   Procedure: CLOSED REDUCTION METACARPAL WITH PERCUTANEOUS PINNING 1 and  2 METACARPAL FRACTURES OF RIGHT HAND;  Surgeon: Christena Flake, MD;  Location: [facility];  Service: Orthopedics;  Laterality: Right;  . ORIF ANKLE FRACTURE Right 01/16/2016   Procedure: OPEN REDUCTION INTERNAL FIXATION (ORIF) ANKLE FRACTURE;  Surgeon: Christena Flake, MD;  Location: [facility];  Service: Orthopedics;  Laterality: Right;  Latex allergy  .  RADIOFREQUENCY ABLATION NERVES     lumbar region of back  . TONSILLECTOMY      OB History   No obstetric history on file.      Home Medications    Prior to Admission medications   Medication Sig Start Date End Date Taking? Authorizing Provider  acetaminophen (TYLENOL) 500 MG tablet Take 500 mg by mouth every 6 (six) hours as needed.   Yes [provider]  ibuprofen (ADVIL) 200 MG tablet Take by mouth every 6 (six) hours as needed.   Yes [provider]  buPROPion (WELLBUTRIN SR) 100 MG 12 hr tablet TAKE 1 TABLET(100 MG) BY MOUTH TWICE DAILY 12/07/19   Doreene Nest, NP  hydrOXYzine (ATARAX/VISTARIL) 25 MG tablet Take 1 tablet (25 mg total) by mouth every 6 (six) hours. 12/09/19   Hall-Potvin, [place], PA-C  meloxicam (MOBIC) 15 MG tablet Take 15 mg by mouth daily. 09/05/19   [provider]  triamcinolone ointment (KENALOG) 0.5 % Apply 1 application topically 2 (two) times daily. 12/09/19   Hall-Potvin, [place], PA-C  valACYclovir (VALTREX) 1000 MG tablet Take 1 tablet (1,000 mg total) by mouth 3 (three) times daily. 12/09/19   Hall-Potvin, [place], PA-C    Family History Family History  Problem Relation Age of Onset  . Diabetes Mother   . Hypertension Mother   . Heart attack Father   . Early death Father        62  .  Hypertension Father     Social History Social History   Tobacco Use  . Smoking status: Former Smoker    Packs/day: 1.00    Years: 13.00    Pack years: 13.00    Quit date: 05/17/2015    Years since quitting: 4.5  . Smokeless tobacco: Never Used  Substance Use Topics  . Alcohol use: No  . Drug use: No     Allergies   Drospirenone-ethinyl estradiol and Latex   Review of Systems As per HPI   Physical Exam Triage Vital Signs ED Triage Vitals  Enc Vitals Group     BP      Pulse      Resp      Temp      Temp src      SpO2      Weight      Height      Head Circumference      Peak Flow      Pain Score       Pain Loc      Pain Edu?      Excl. in GC?    No data found.  Updated Vital Signs BP 125/78 (BP Location: Right Arm)   Pulse 71   Temp 98.4 F (36.9 C) (Oral)   Resp 18   SpO2 98%   Visual Acuity Right Eye Distance:   Left Eye Distance:   Bilateral Distance:    Right Eye Near:   Left Eye Near:    Bilateral Near:     Physical Exam Constitutional:      General: She is not in acute distress. HENT:     Head: Normocephalic and atraumatic.  Eyes:     General: No scleral icterus.    Pupils: Pupils are equal, round, and reactive to light.  Cardiovascular:     Rate and Rhythm: Normal rate.  Pulmonary:     Effort: Pulmonary effort is normal.  Skin:    Coloration: Skin is not jaundiced or pale.     Comments: Scattered, mild macular vesicular lesions without open wound, active discharge, streaking, tenderness noted to lateral aspect of left arm.  Neurological:     Mental Status: She is alert and oriented to person, place, and time.      UC Treatments / Results  Labs (all labs ordered are listed, but only abnormal results are displayed) Labs Reviewed - No data to display  EKG   Radiology No results found.  Procedures Procedures (including critical care time)  Medications Ordered in UC Medications - No data to display  Initial Impression / Assessment and Plan / UC Course  I have reviewed the triage vital signs and the nursing notes.  Pertinent labs & imaging results that were available during my care of the patient were reviewed by me and considered in my medical decision making (see chart for details).     H&P concerning for repeat herpes zoster.  Will treat supportively as outlined below.  Return precautions discussed, patient verbalized understanding and is agreeable to plan. Final Clinical Impressions(s) / UC Diagnoses   Final diagnoses:  Herpes zoster without complication     Discharge Instructions     Take valtrex 3 times a day for a week. Apply  triamcinolone 2 times a day for a week. May take hydroxyzine before bed. Return for worsening rash, pain, fever, bleeding.    ED Prescriptions    Medication Sig Dispense Auth. Provider   triamcinolone ointment (KENALOG) 0.5 %  Apply 1 application topically 2 (two) times daily. 30 g Hall-Potvin, [place], PA-C   valACYclovir (VALTREX) 1000 MG tablet Take 1 tablet (1,000 mg total) by mouth 3 (three) times daily. 21 tablet Hall-Potvin, [place], PA-C   hydrOXYzine (ATARAX/VISTARIL) 25 MG tablet Take 1 tablet (25 mg total) by mouth every 6 (six) hours. 12 tablet Hall-Potvin, [place], PA-C     PDMP not reviewed this encounter.   Hall-Potvin, [place], [place] 12/09/19 2108

## 2019-12-09 NOTE — ED Triage Notes (Signed)
Pt presents with rash in the left arm after she had a tatoo 2 weeks ago. Pt denies chills or fever.

## 2019-12-09 NOTE — Discharge Instructions (Signed)
Take valtrex 3 times a day for a week. Apply triamcinolone 2 times a day for a week. May take hydroxyzine before bed. Return for worsening rash, pain, fever, bleeding.

## 2019-12-15 ENCOUNTER — Other Ambulatory Visit: Payer: Self-pay

## 2019-12-15 ENCOUNTER — Ambulatory Visit (INDEPENDENT_AMBULATORY_CARE_PROVIDER_SITE_OTHER): Payer: 59 | Admitting: Primary Care

## 2019-12-15 ENCOUNTER — Encounter: Payer: Self-pay | Admitting: Primary Care

## 2019-12-15 DIAGNOSIS — R21 Rash and other nonspecific skin eruption: Secondary | ICD-10-CM | POA: Diagnosis not present

## 2019-12-15 DIAGNOSIS — G8929 Other chronic pain: Secondary | ICD-10-CM

## 2019-12-15 DIAGNOSIS — M5440 Lumbago with sciatica, unspecified side: Secondary | ICD-10-CM

## 2019-12-15 NOTE — Progress Notes (Signed)
Subjective:    Patient ID: Julia Mckay, female    DOB: 05/13/1986, 34 y.o.   MRN: [id number]  HPI  This visit occurred during the SARS-CoV-2 public health emergency.  Safety protocols were in place, including screening questions prior to the visit, additional usage of staff PPE, and extensive cleaning of exam room while observing appropriate contact time as indicated for disinfecting solutions.   Julia Mckay is a 34 year old female with a history of herpes zoster, migraines, tobacco abuse who presents today for urgent care follow up and follow up of chronic back pain.  She presented to [facility] urgent care on 12/09/19 with a chief complaint of rash. She had a tattoo of the left forearm completed on 11/26/19, 10 days later she developed a rash to the left forearm at the same site. She described her rash as itchy with tenderness. She was diagnosed with herpes zoster and treated with a Valtrex course, triamcinolone, and hydroxyzine.   Since her urgent care visit she's compliant to her regimen. She denies recent yard work, fevers, erythema. Her rash doesn't appear similar to her prior shingles episode, but she has noticed improvement to the rash since she's started Valtrex. The triamcinolone ointment is helping with itching.   She's also had no improvement in her chronic lower back pain despite 4-5 sessions of PT and home exercises. She continues to experience mid lower back pain with radiation to left lower extremity. She once saw a spine center in [place], underwent nerve ablations in the past. She is interested in seeing a spine specialist locally.   Review of Systems  Constitutional: Negative for fever.  Musculoskeletal: Positive for back pain.  Skin: Positive for rash.  Neurological: Positive for numbness.       Past Medical History:  Diagnosis Date  . ADHD   . Ankylosing spondylitis (HCC)   . Arthritis   . Chronic foot pain   . DDD (degenerative disc disease), lumbar   . HERPES ZOSTER,  UNCOMPLICATED 11/10/2006   Qualifier: Diagnosis of  By: Ermalene Searing MD, Amy    . MIGRAINE HEADACHE 01/12/2007   Qualifier: History of  By: Lindwood Qua CMA, Jerl Santos    . Tobacco abuse   . UTI (urinary tract infection)      Social History   Socioeconomic History  . Marital status: Divorced    Spouse name: Not on file  . Number of children: Not on file  . Years of education: Not on file  . Highest education level: Not on file  Occupational History  . Not on file  Tobacco Use  . Smoking status: Former Smoker    Packs/day: 1.00    Years: 13.00    Pack years: 13.00    Quit date: 05/17/2015    Years since quitting: 4.5  . Smokeless tobacco: Never Used  Substance and Sexual Activity  . Alcohol use: No  . Drug use: No  . Sexual activity: Not on file  Other Topics Concern  . Not on file  Social History Narrative  . Not on file   Social Determinants of Health   Financial Resource Strain:   . Difficulty of Paying Living Expenses:   Food Insecurity:   . Worried About Programme researcher, broadcasting/film/video in the Last Year:   . Barista in the Last Year:   Transportation Needs:   . Freight forwarder (Medical):   Marland Kitchen Lack of Transportation (Non-Medical):   Physical Activity:   . Days of  Exercise per Week:   . Minutes of Exercise per Session:   Stress:   . Feeling of Stress :   Social Connections:   . Frequency of Communication with Friends and Family:   . Frequency of Social Gatherings with Friends and Family:   . Attends Religious Services:   . Active Member of Clubs or Organizations:   . Attends Banker Meetings:   Marland Kitchen Marital Status:   Intimate Partner Violence:   . Fear of Current or Ex-Partner:   . Emotionally Abused:   Marland Kitchen Physically Abused:   . Sexually Abused:     Past Surgical History:  Procedure Laterality Date  . CLOSED REDUCTION METACARPAL WITH PERCUTANEOUS PINNING Right 01/16/2016   Procedure: CLOSED REDUCTION METACARPAL WITH PERCUTANEOUS PINNING 1 and  2  METACARPAL FRACTURES OF RIGHT HAND;  Surgeon: Christena Flake, MD;  Location: [facility];  Service: Orthopedics;  Laterality: Right;  . ORIF ANKLE FRACTURE Right 01/16/2016   Procedure: OPEN REDUCTION INTERNAL FIXATION (ORIF) ANKLE FRACTURE;  Surgeon: Christena Flake, MD;  Location: [facility];  Service: Orthopedics;  Laterality: Right;  Latex allergy  . RADIOFREQUENCY ABLATION NERVES     lumbar region of back  . TONSILLECTOMY      Family History  Problem Relation Age of Onset  . Diabetes Mother   . Hypertension Mother   . Heart attack Father   . Early death Father        16  . Hypertension Father     Allergies  Allergen Reactions  . Drospirenone-Ethinyl Estradiol     REACTION: moodiness  . Latex Itching    Current Outpatient Medications on File Prior to Visit  Medication Sig Dispense Refill  . acetaminophen (TYLENOL) 500 MG tablet Take 500 mg by mouth every 6 (six) hours as needed.    Marland Kitchen buPROPion (WELLBUTRIN SR) 100 MG 12 hr tablet TAKE 1 TABLET(100 MG) BY MOUTH TWICE DAILY 180 tablet 0  . hydrOXYzine (ATARAX/VISTARIL) 25 MG tablet Take 1 tablet (25 mg total) by mouth every 6 (six) hours. 12 tablet 0  . ibuprofen (ADVIL) 200 MG tablet Take by mouth every 6 (six) hours as needed.    . meloxicam (MOBIC) 15 MG tablet Take 15 mg by mouth daily.    Marland Kitchen triamcinolone ointment (KENALOG) 0.5 % Apply 1 application topically 2 (two) times daily. 30 g 0  . valACYclovir (VALTREX) 1000 MG tablet Take 1 tablet (1,000 mg total) by mouth 3 (three) times daily. 21 tablet 0   No current facility-administered medications on file prior to visit.    BP 130/82   Pulse 82   Temp (!) 96 F (35.6 C) (Temporal)   Ht 5\' 6"  (1.676 m)   Wt 214 lb 12 oz (97.4 kg)   SpO2 98%   BMI 34.66 kg/m    Objective:   Physical Exam Pulmonary:     Effort: Pulmonary effort is normal.  Skin:    Findings: Rash present.     Comments: Several small, dried appearing, rounded, red spots to left forearm  with a few spots to left upper arm. No open lesions. Her left forearm is covered mostly in tattoos so visualization of rash is more difficult.   Neurological:     Mental Status: She is alert.            Assessment & Plan:

## 2019-12-15 NOTE — Assessment & Plan Note (Signed)
Treated for herpes zoster at urgent care. Not quite convinced that her symptoms are herpes zoster,but again exam was difficult given tattoos.  She is improving, not sure if this is really from topical steroids or Valtrex. For this reason we will continue both treatments. She will update if no continued improvement. No cellulitis or other complications.

## 2019-12-15 NOTE — Assessment & Plan Note (Signed)
Continued, no improvement with PT sessions and home exercises. Referral placed to spine specialist.

## 2019-12-15 NOTE — Patient Instructions (Signed)
You will be contacted regarding your referral to the spine center.  Please let us know if you have not been contacted within two weeks.   Continue valacyclovir until gone.  Update me if anything changes.   It was a pleasure to see you today!

## 2019-12-21 DIAGNOSIS — R21 Rash and other nonspecific skin eruption: Secondary | ICD-10-CM

## 2019-12-27 MED ORDER — PREDNISONE 20 MG PO TABS: ORAL_TABLET | ORAL | 0 refills | Status: DC

## 2020-01-30 ENCOUNTER — Telehealth: Payer: Self-pay | Admitting: Primary Care

## 2020-01-30 NOTE — Telephone Encounter (Signed)
Patient called.  She said she's been sending messages to Jae Dire about a skin condition.  Patient said it's been going on since Mid-July.  Patient said it's now spreading to her face.  It stays on the left side of her body.  Kate's recommend patient keep her updated and schedule an office visit.  Patient scheduled next available appointment with Jae Dire on 02/07/20.  Patient said Jae Dire, also, recommended referring patient to a dermatologist.   Patient can go to [place] or [place] and patient can go anytime.

## 2020-01-30 NOTE — Telephone Encounter (Signed)
If earlier appointments available would recommend in-person evaluation and can discuss dermatology visit if necessary

## 2020-01-30 NOTE — Telephone Encounter (Signed)
Spoken to patient and able to reschedule patient for this Thursday 02/02/2020 at 8 am

## 2020-02-02 ENCOUNTER — Other Ambulatory Visit: Payer: Self-pay

## 2020-02-02 ENCOUNTER — Ambulatory Visit (INDEPENDENT_AMBULATORY_CARE_PROVIDER_SITE_OTHER): Payer: 59 | Admitting: Primary Care

## 2020-02-02 ENCOUNTER — Encounter: Payer: Self-pay | Admitting: Primary Care

## 2020-02-02 VITALS — BP 130/82 | HR 97 | Temp 95.9°F | Ht 66.0 in | Wt 226.8 lb

## 2020-02-02 DIAGNOSIS — B009 Herpesviral infection, unspecified: Secondary | ICD-10-CM | POA: Diagnosis not present

## 2020-02-02 DIAGNOSIS — R21 Rash and other nonspecific skin eruption: Secondary | ICD-10-CM

## 2020-02-02 MED ORDER — CLOBETASOL PROPIONATE 0.05 % EX OINT: 1.0000 "application " | TOPICAL_OINTMENT | Freq: Two times a day (BID) | CUTANEOUS | 0 refills | Status: DC

## 2020-02-02 MED ORDER — VALACYCLOVIR HCL 1 G PO TABS: 1000.0000 mg | ORAL_TABLET | Freq: Two times a day (BID) | ORAL | 0 refills | Status: DC

## 2020-02-02 NOTE — Progress Notes (Signed)
Subjective:    Patient ID: Julia Mckay, female    DOB: 01/19/1986, 34 y.o.   MRN: [id number]  HPI  This visit occurred during the SARS-CoV-2 public health emergency.  Safety protocols were in place, including screening questions prior to the visit, additional usage of staff PPE, and extensive cleaning of exam room while observing appropriate contact time as indicated for disinfecting solutions.   Ms. Julia Mckay is a 34 year old female with a history of chronic back pain, ADHD, rash who presents today for follow up of rash.  She was initially seen at urgent care in June 2021 for left upper extremity rash, treated for what they believed to be herpes zoster. She followed up in our clinic in early July, rash improving with Valtrex, topical steroids, hydroxyzine, but without resolve. About one week later she was treated with a prednisone course which helped significantly but for a brief period of time.   Today she endorses that she continues to notice the rash which presents as "white heads" and blisters that will sometimes pop. Now she's noticed bumps to her left lower extremity, left lateral trunk, and left lower lip. Her bumps are very itchy at times. She's been using triamcinolone 0.5% ointment with slight improvement in itching but without resolve in spots.   Review of Systems  HENT: Negative for trouble swallowing.   Respiratory: Negative for shortness of breath.   Skin: Positive for rash.       Past Medical History:  Diagnosis Date  . ADHD   . Ankylosing spondylitis (HCC)   . Arthritis   . Chronic foot pain   . DDD (degenerative disc disease), lumbar   . HERPES ZOSTER, UNCOMPLICATED 11/10/2006   Qualifier: Diagnosis of  By: Ermalene Searing MD, Amy    . MIGRAINE HEADACHE 01/12/2007   Qualifier: History of  By: Lindwood Qua CMA, Jerl Santos    . Tobacco abuse   . UTI (urinary tract infection)      Social History   Socioeconomic History  . Marital status: Divorced    Spouse name: Not on file   . Number of children: Not on file  . Years of education: Not on file  . Highest education level: Not on file  Occupational History  . Not on file  Tobacco Use  . Smoking status: Former Smoker    Packs/day: 1.00    Years: 13.00    Pack years: 13.00    Quit date: 05/17/2015    Years since quitting: 4.7  . Smokeless tobacco: Never Used  Substance and Sexual Activity  . Alcohol use: No  . Drug use: No  . Sexual activity: Not on file  Other Topics Concern  . Not on file  Social History Narrative  . Not on file   Social Determinants of Health   Financial Resource Strain:   . Difficulty of Paying Living Expenses: Not on file  Food Insecurity:   . Worried About Programme researcher, broadcasting/film/video in the Last Year: Not on file  . Ran Out of Food in the Last Year: Not on file  Transportation Needs:   . Lack of Transportation (Medical): Not on file  . Lack of Transportation (Non-Medical): Not on file  Physical Activity:   . Days of Exercise per Week: Not on file  . Minutes of Exercise per Session: Not on file  Stress:   . Feeling of Stress : Not on file  Social Connections:   . Frequency of Communication with Friends and Family:  Not on file  . Frequency of Social Gatherings with Friends and Family: Not on file  . Attends Religious Services: Not on file  . Active Member of Clubs or Organizations: Not on file  . Attends Banker Meetings: Not on file  . Marital Status: Not on file  Intimate Partner Violence:   . Fear of Current or Ex-Partner: Not on file  . Emotionally Abused: Not on file  . Physically Abused: Not on file  . Sexually Abused: Not on file    Past Surgical History:  Procedure Laterality Date  . CLOSED REDUCTION METACARPAL WITH PERCUTANEOUS PINNING Right 01/16/2016   Procedure: CLOSED REDUCTION METACARPAL WITH PERCUTANEOUS PINNING 1 and  2 METACARPAL FRACTURES OF RIGHT HAND;  Surgeon: Christena Flake, MD;  Location: [facility];  Service: Orthopedics;   Laterality: Right;  . ORIF ANKLE FRACTURE Right 01/16/2016   Procedure: OPEN REDUCTION INTERNAL FIXATION (ORIF) ANKLE FRACTURE;  Surgeon: Christena Flake, MD;  Location: [facility];  Service: Orthopedics;  Laterality: Right;  Latex allergy  . RADIOFREQUENCY ABLATION NERVES     lumbar region of back  . TONSILLECTOMY      Family History  Problem Relation Age of Onset  . Diabetes Mother   . Hypertension Mother   . Heart attack Father   . Early death Father        14  . Hypertension Father     Allergies  Allergen Reactions  . Drospirenone-Ethinyl Estradiol     REACTION: moodiness  . Latex Itching    Current Outpatient Medications on File Prior to Visit  Medication Sig Dispense Refill  . acetaminophen (TYLENOL) 500 MG tablet Take 500 mg by mouth every 6 (six) hours as needed.    Marland Kitchen buPROPion (WELLBUTRIN SR) 100 MG 12 hr tablet TAKE 1 TABLET(100 MG) BY MOUTH TWICE DAILY 180 tablet 0  . ibuprofen (ADVIL) 200 MG tablet Take by mouth every 6 (six) hours as needed.    . meloxicam (MOBIC) 15 MG tablet Take 15 mg by mouth daily.    Marland Kitchen triamcinolone ointment (KENALOG) 0.5 % Apply 1 application topically 2 (two) times daily. 30 g 0   No current facility-administered medications on file prior to visit.    BP 130/82   Pulse 97   Temp (!) 95.9 F (35.5 C) (Temporal)   Ht 5\' 6"  (1.676 m)   Wt 226 lb 12 oz (102.9 kg)   SpO2 97%   BMI 36.60 kg/m    Objective:   Physical Exam Pulmonary:     Effort: Pulmonary effort is normal.  Skin:    General: Skin is warm and dry.     Findings: Rash present.     Comments: Several red, raised bumps to left upper extremity, one noted to left lower extremity proximal to ankle, one to left lateral trunk. No surrounding erythema. No rash between webs of fingers.   Herpetic lesion to left lower lip noted. Crusting.   Neurological:     Mental Status: She is alert.  Psychiatric:        Mood and Affect: Mood normal.            Assessment &  Plan:

## 2020-02-02 NOTE — Assessment & Plan Note (Signed)
Continued without resolve despite treatment. Unclear etiology, doesn't appear to be an allergic reaction from tattoo, especially given that she has these bumps now to her trunk and lower extremity.  Stop triamcinolone ointment, start clobetasol ointment.  Will treat herpetic lesion today with Valtrex course.   Referral to dermatology placed.

## 2020-02-02 NOTE — Patient Instructions (Addendum)
Start clobetasol ointment, apply to the spots twice daily for 7-10 days.  Start valacyclovir 1000 mg tablets. Take 1 tablet by mouth twice daily for 7 days.   You will be contacted regarding your referral to Dermatology.  Please let us know if you have not been contacted within two weeks.   It was a pleasure to see you today!

## 2020-02-06 ENCOUNTER — Encounter: Payer: Self-pay | Admitting: Dermatology

## 2020-02-06 ENCOUNTER — Ambulatory Visit (INDEPENDENT_AMBULATORY_CARE_PROVIDER_SITE_OTHER): Payer: 59 | Admitting: Dermatology

## 2020-02-06 ENCOUNTER — Other Ambulatory Visit: Payer: Self-pay | Admitting: Dermatology

## 2020-02-06 ENCOUNTER — Other Ambulatory Visit: Payer: Self-pay

## 2020-02-06 DIAGNOSIS — B009 Herpesviral infection, unspecified: Secondary | ICD-10-CM | POA: Diagnosis not present

## 2020-02-06 DIAGNOSIS — R21 Rash and other nonspecific skin eruption: Secondary | ICD-10-CM

## 2020-02-06 NOTE — Patient Instructions (Signed)

## 2020-02-06 NOTE — Progress Notes (Signed)
   New Patient Visit  Subjective  Julia Mckay is a 34 y.o. female who presents for the following: Rash (Patient had tattoo done 7/12 at left arm and broke out about 4 days later. ). Patient went to Urgent Care and was put on Valtrex 1g twice daily for about 7 days and was told it was shingles. Rash cleared up until she stopped the Valtrex and then rash came back so pt went to primary care and was given prednisone, which cleared rash. Once off the prednisone the rash came back and has since spread but only on the left side of her body.   The following portions of the chart were reviewed this encounter and updated as appropriate:  Tobacco  Allergies  Meds  Problems  Med Hx  Surg Hx  Fam Hx     Review of Systems:  No other skin or systemic complaints except as noted in HPI or Assessment and Plan.  Objective  Well appearing patient in no apparent distress; mood and affect are within normal limits.  A focused examination was performed including face, arms, legs. Relevant physical exam findings are noted in the Assessment and Plan.  Objective  left lower lip: crust  Images    Objective  Left lateral neck: 14mm papulopustule at left lateral neck Small crusts on arm, crust lower lip    Images       Assessment & Plan  Herpes simplex left lower lip Valtrex 1g bid "fever blisters"= HSV will recur especially with triggers of sunlight, stress, trauma.  She should treat at for symptoms of onset of fever blister outbreak.  She may take 1 g 2 pills immediately then 2 pills 12 hours later.  Other option would be 1 pill twice a day for 5 days. Other Related Medications valACYclovir (VALTREX) 1000 MG tablet  Rash Left lateral neck; Left arm Consider disseminated herpes versus folliculitis versus other. Family hx lupus, patient was tested but markers were low  Continue Valtrex 1g twice daily. Patient will call for refill.   Skin / nail biopsy - Left lateral neck Type of  biopsy: punch   Informed consent: discussed and consent obtained   Timeout: patient name, date of birth, surgical site, and procedure verified   Procedure prep:  Patient was prepped and draped in usual sterile fashion (the patient was cleaned and prepped) Prep type:  Isopropyl alcohol Anesthesia: the lesion was anesthetized in a standard fashion   Anesthetic:  1% lidocaine w/ epinephrine 1-100,000 buffered w/ 8.4% NaHCO3 Punch size:  3 mm Suture size:  4-0 Suture type: nylon   Hemostasis achieved with: suture, pressure and aluminum chloride   Outcome: patient tolerated procedure well   Post-procedure details: sterile dressing applied and wound care instructions given   Dressing type: bandage, petrolatum and pressure dressing    Specimen 1 - Surgical pathology Differential Diagnosis: HSV vs Other Check Margins: No 64mm papulopustule at left lateral neck   Return in about 1 week (around 02/13/2020) for Suture Removal.  Anise Salvo, RMA, am acting as scribe for Armida Sans, MD . Documentation: I have reviewed the above documentation for accuracy and completeness, and I agree with the above.  Armida Sans, MD

## 2020-02-07 ENCOUNTER — Ambulatory Visit: Payer: 59 | Admitting: Primary Care

## 2020-02-08 ENCOUNTER — Other Ambulatory Visit: Payer: Self-pay

## 2020-02-08 DIAGNOSIS — B009 Herpesviral infection, unspecified: Secondary | ICD-10-CM

## 2020-02-08 NOTE — Telephone Encounter (Signed)
Patient comment: Dermatologist wanted me to keep taking this for one more week.

## 2020-02-09 MED ORDER — VALACYCLOVIR HCL 1 G PO TABS: 1000.0000 mg | ORAL_TABLET | Freq: Two times a day (BID) | ORAL | 0 refills | Status: DC

## 2020-02-09 NOTE — Telephone Encounter (Signed)
Noted, refill sent to pharmacy. 

## 2020-02-13 ENCOUNTER — Other Ambulatory Visit: Payer: Self-pay

## 2020-02-13 ENCOUNTER — Ambulatory Visit: Payer: 59 | Admitting: Dermatology

## 2020-02-13 DIAGNOSIS — L738 Other specified follicular disorders: Secondary | ICD-10-CM

## 2020-02-13 MED ORDER — FLUCONAZOLE 200 MG PO TABS: ORAL_TABLET | ORAL | 0 refills | Status: DC

## 2020-02-13 MED ORDER — KETOCONAZOLE 2 % EX SHAM: MEDICATED_SHAMPOO | CUTANEOUS | 3 refills | Status: AC

## 2020-02-13 NOTE — Progress Notes (Signed)
   Follow-Up Visit   Subjective  Julia Mckay is a 34 y.o. female who presents for the following: suture removal (patient is here today for suture removal, pathology results, and to discuss treatment options).  The following portions of the chart were reviewed this encounter and updated as appropriate:  Tobacco  Allergies  Meds  Problems  Med Hx  Surg Hx  Fam Hx     Review of Systems:  No other skin or systemic complaints except as noted in HPI or Assessment and Plan.  Objective  Well appearing patient in no apparent distress; mood and affect are within normal limits.  A focused examination was performed including the trunk and extremities. Relevant physical exam findings are noted in the Assessment and Plan.  Objective  Trunk, extremities: Crusts  Assessment & Plan  Pityrosporum folliculitis Trunk, extremities Bx proven chronic granulomatous pityrosporum (not HSV or disseminated HSV)-  Discussed condition with patient.  Start Diflucan 200mg  po QOD on Monday, Wednesday, and Friday. #12 0RF.  Start Ketoconazole 2% shampoo QD let sit 5 minutes before washing off. Saturday 3RF.  Condition may recur in the future  fluconazole (DIFLUCAN) 200 MG tablet - Trunk, extremities  ketoconazole (NIZORAL) 2 % shampoo - Trunk, extremities  Return in about 3 months (around 05/15/2020) for F/U appt.05/17/2020, CMA, am acting as scribe for Maylene Roes, MD .  Documentation: I have reviewed the above documentation for accuracy and completeness, and I agree with the above.  Armida Sans, MD

## 2020-02-14 ENCOUNTER — Encounter: Payer: Self-pay | Admitting: Dermatology

## 2020-02-17 ENCOUNTER — Encounter: Payer: Self-pay | Admitting: Dermatology

## 2020-03-05 ENCOUNTER — Other Ambulatory Visit: Payer: Self-pay | Admitting: Primary Care

## 2020-03-05 DIAGNOSIS — Z720 Tobacco use: Secondary | ICD-10-CM

## 2020-04-24 ENCOUNTER — Ambulatory Visit (INDEPENDENT_AMBULATORY_CARE_PROVIDER_SITE_OTHER): Payer: 59 | Admitting: Primary Care

## 2020-04-24 ENCOUNTER — Encounter: Payer: Self-pay | Admitting: Primary Care

## 2020-04-24 ENCOUNTER — Other Ambulatory Visit: Payer: Self-pay

## 2020-04-24 VITALS — BP 118/62 | HR 88 | Temp 98.3°F | Ht 66.0 in | Wt 220.0 lb

## 2020-04-24 DIAGNOSIS — R319 Hematuria, unspecified: Secondary | ICD-10-CM

## 2020-04-24 DIAGNOSIS — R300 Dysuria: Secondary | ICD-10-CM | POA: Diagnosis not present

## 2020-04-24 LAB — POC URINALSYSI DIPSTICK (AUTOMATED)
Bilirubin, UA: NEGATIVE
Glucose, UA: NEGATIVE
Ketones, UA: 5
Nitrite, UA: NEGATIVE
Protein, UA: POSITIVE — AB
Spec Grav, UA: 1.02 (ref 1.010–1.025)
Urobilinogen, UA: 0.2 E.U./dL
pH, UA: 6 (ref 5.0–8.0)

## 2020-04-24 LAB — BASIC METABOLIC PANEL
BUN: 13 mg/dL (ref 6–23)
CO2: 28 mEq/L (ref 19–32)
Calcium: 9.4 mg/dL (ref 8.4–10.5)
Chloride: 104 mEq/L (ref 96–112)
Creatinine, Ser: 0.78 mg/dL (ref 0.40–1.20)
GFR: 100.45 mL/min (ref >60.00)
Glucose, Bld: 94 mg/dL (ref 70–99)
Potassium: 4.4 mEq/L (ref 3.5–5.1)
Sodium: 138 mEq/L (ref 135–145)

## 2020-04-24 LAB — CBC WITH DIFFERENTIAL/PLATELET
Basophils Absolute: 0.1 10*3/uL (ref 0.0–0.1)
Basophils Relative: 0.5 % (ref 0.0–3.0)
Eosinophils Absolute: 0.3 10*3/uL (ref 0.0–0.7)
Eosinophils Relative: 2 % (ref 0.0–5.0)
HCT: 43.5 % (ref 36.0–46.0)
Hemoglobin: 14.6 g/dL (ref 12.0–15.0)
Lymphocytes Relative: 19.4 % (ref 12.0–46.0)
Lymphs Abs: 2.9 10*3/uL (ref 0.7–4.0)
MCHC: 33.5 g/dL (ref 30.0–36.0)
MCV: 88.1 fl (ref 78.0–100.0)
Monocytes Absolute: 0.9 10*3/uL (ref 0.1–1.0)
Monocytes Relative: 6 % (ref 3.0–12.0)
Neutro Abs: 10.8 10*3/uL — ABNORMAL HIGH (ref 1.4–7.7)
Neutrophils Relative %: 72.1 % (ref 43.0–77.0)
Platelets: 252 10*3/uL (ref 150.0–400.0)
RBC: 4.94 Mil/uL (ref 3.87–5.11)
RDW: 13.4 % (ref 11.5–15.5)
WBC: 15 10*3/uL — ABNORMAL HIGH (ref 4.0–10.5)

## 2020-04-24 MED ORDER — CIPROFLOXACIN HCL 500 MG PO TABS: 500.0000 mg | ORAL_TABLET | Freq: Two times a day (BID) | ORAL | 0 refills | Status: DC

## 2020-04-24 NOTE — Assessment & Plan Note (Signed)
Gross hematuria with other associated symptoms that are suspicious for acute cystitis.  Need to rule out acute pyelonephritis.  Lower suspicion for renal stone.  UA with 2+ leuks, 3+ blood, negative nitrites. Culture sent and pending.  Check CBC with diff and BMP to rule out renal infection.  Discussed to push intake of water. Rx for Cipro course sent to pharmacy.  Await culture results.

## 2020-04-24 NOTE — Patient Instructions (Signed)
Start ciprofloxacin 500 mg tablets. Take 1 tablet by mouth twice daily for three days.  Be sure to push intake of water.   Stop by the lab prior to leaving today. I will notify you of your results once received.   It was a pleasure to see you today!

## 2020-04-24 NOTE — Progress Notes (Signed)
Subjective:    Patient ID: Julia Mckay, female    DOB: Jan 25, 1986, 34 y.o.   MRN: [id number]  HPI  This visit occurred during the SARS-CoV-2 public health emergency.  Safety protocols were in place, including screening questions prior to the visit, additional usage of staff PPE, and extensive cleaning of exam room while observing appropriate contact time as indicated for disinfecting solutions.   Julia Mckay is a 34 year old female with a history of chronic back pain, acute pyelonephritis, renal stone, recurrent cystitis during pregnancy, tobacco abuse, family history of early CAD who presents today with a chief complaint of hematuria.   Symptoms began yesterday with urinary frequency with urgency. Today she woke up with nausea, dysuria,  hematuria (x4 episodes), pelvic cramping, and right lower back pain.   She denies vaginal discharge, vaginal itching, upper abdominal pain, fevers, chills. She took some Ibuprofen with some improvement. She is smoking 5 cigarettes daily.    Her last menstrual cycle was 5-6 years ago as she has an IUD.   BP Readings from Last 3 Encounters:  04/24/20 118/62  02/02/20 130/82  12/15/19 130/82     Review of Systems  Constitutional: Negative for chills and fever.  Genitourinary: Positive for dysuria, frequency, hematuria, pelvic pain and urgency. Negative for vaginal discharge.  Musculoskeletal: Positive for back pain.        Past Medical History:  Diagnosis Date  . ADHD   . Ankylosing spondylitis (HCC)   . Arthritis   . Chronic foot pain   . DDD (degenerative disc disease), lumbar   . HERPES ZOSTER, UNCOMPLICATED 11/10/2006   Qualifier: Diagnosis of  By: Ermalene Searing MD, Amy    . MIGRAINE HEADACHE 01/12/2007   Qualifier: History of  By: Lindwood Qua CMA, Jerl Santos    . Tobacco abuse   . UTI (urinary tract infection)      Social History   Socioeconomic History  . Marital status: Divorced    Spouse name: Not on file  . Number of children: Not on  file  . Years of education: Not on file  . Highest education level: Not on file  Occupational History  . Not on file  Tobacco Use  . Smoking status: Former Smoker    Packs/day: 1.00    Years: 13.00    Pack years: 13.00    Quit date: 05/17/2015    Years since quitting: 4.9  . Smokeless tobacco: Never Used  Substance and Sexual Activity  . Alcohol use: No  . Drug use: No  . Sexual activity: Not on file  Other Topics Concern  . Not on file  Social History Narrative  . Not on file   Social Determinants of Health   Financial Resource Strain:   . Difficulty of Paying Living Expenses: Not on file  Food Insecurity:   . Worried About Programme researcher, broadcasting/film/video in the Last Year: Not on file  . Ran Out of Food in the Last Year: Not on file  Transportation Needs:   . Lack of Transportation (Medical): Not on file  . Lack of Transportation (Non-Medical): Not on file  Physical Activity:   . Days of Exercise per Week: Not on file  . Minutes of Exercise per Session: Not on file  Stress:   . Feeling of Stress : Not on file  Social Connections:   . Frequency of Communication with Friends and Family: Not on file  . Frequency of Social Gatherings with Friends and Family: Not on  file  . Attends Religious Services: Not on file  . Active Member of Clubs or Organizations: Not on file  . Attends Banker Meetings: Not on file  . Marital Status: Not on file  Intimate Partner Violence:   . Fear of Current or Ex-Partner: Not on file  . Emotionally Abused: Not on file  . Physically Abused: Not on file  . Sexually Abused: Not on file    Past Surgical History:  Procedure Laterality Date  . CLOSED REDUCTION METACARPAL WITH PERCUTANEOUS PINNING Right 01/16/2016   Procedure: CLOSED REDUCTION METACARPAL WITH PERCUTANEOUS PINNING 1 and  2 METACARPAL FRACTURES OF RIGHT HAND;  Surgeon: Christena Flake, MD;  Location: [facility];  Service: Orthopedics;  Laterality: Right;  . ORIF ANKLE  FRACTURE Right 01/16/2016   Procedure: OPEN REDUCTION INTERNAL FIXATION (ORIF) ANKLE FRACTURE;  Surgeon: Christena Flake, MD;  Location: [facility];  Service: Orthopedics;  Laterality: Right;  Latex allergy  . RADIOFREQUENCY ABLATION NERVES     lumbar region of back  . TONSILLECTOMY      Family History  Problem Relation Age of Onset  . Diabetes Mother   . Hypertension Mother   . Heart attack Father   . Early death Father        59  . Hypertension Father     Allergies  Allergen Reactions  . Drospirenone-Ethinyl Estradiol     REACTION: moodiness  . Latex Itching    Current Outpatient Medications on File Prior to Visit  Medication Sig Dispense Refill  . acetaminophen (TYLENOL) 500 MG tablet Take 500 mg by mouth every 6 (six) hours as needed.    Marland Kitchen buPROPion (WELLBUTRIN SR) 100 MG 12 hr tablet TAKE 1 TABLET(100 MG) BY MOUTH TWICE DAILY 180 tablet 0  . ibuprofen (ADVIL) 200 MG tablet Take by mouth every 6 (six) hours as needed.    Marland Kitchen ketoconazole (NIZORAL) 2 % shampoo Shampoo onto body let sit 5 minutes then wash off. Use QD. 120 mL 3   No current facility-administered medications on file prior to visit.    BP 118/62   Pulse 88   Temp 98.3 F (36.8 C) (Temporal)   Ht 5\' 6"  (1.676 m)   Wt 220 lb (99.8 kg)   BMI 35.51 kg/m    Objective:   Physical Exam Cardiovascular:     Rate and Rhythm: Normal rate and regular rhythm.  Pulmonary:     Effort: Pulmonary effort is normal.  Abdominal:     General: Abdomen is flat.     Palpations: Abdomen is soft.     Tenderness: There is abdominal tenderness in the suprapubic area. There is no right CVA tenderness or left CVA tenderness.    Skin:    General: Skin is warm and dry.  Neurological:     Mental Status: She is alert.            Assessment & Plan:

## 2020-04-26 ENCOUNTER — Other Ambulatory Visit: Payer: Self-pay | Admitting: Primary Care

## 2020-04-26 LAB — URINE CULTURE
MICRO NUMBER:: 11179749
SPECIMEN QUALITY:: ADEQUATE

## 2020-05-09 ENCOUNTER — Other Ambulatory Visit: Payer: Self-pay

## 2020-05-09 DIAGNOSIS — R300 Dysuria: Secondary | ICD-10-CM

## 2020-05-14 ENCOUNTER — Other Ambulatory Visit: Payer: Self-pay

## 2020-05-14 ENCOUNTER — Other Ambulatory Visit (INDEPENDENT_AMBULATORY_CARE_PROVIDER_SITE_OTHER): Payer: 59

## 2020-05-14 ENCOUNTER — Other Ambulatory Visit: Payer: Self-pay | Admitting: Primary Care

## 2020-05-14 DIAGNOSIS — R300 Dysuria: Secondary | ICD-10-CM

## 2020-05-14 DIAGNOSIS — D72829 Elevated white blood cell count, unspecified: Secondary | ICD-10-CM

## 2020-05-14 LAB — POC URINALSYSI DIPSTICK (AUTOMATED)
Bilirubin, UA: NEGATIVE
Blood, UA: NEGATIVE
Glucose, UA: NEGATIVE
Ketones, UA: NEGATIVE
Leukocytes, UA: NEGATIVE
Nitrite, UA: NEGATIVE
Protein, UA: NEGATIVE
Spec Grav, UA: 1.015 (ref 1.010–1.025)
Urobilinogen, UA: 0.2 E.U./dL
pH, UA: 7.5 (ref 5.0–8.0)

## 2020-05-14 LAB — CBC WITH DIFFERENTIAL/PLATELET
Basophils Absolute: 0.1 10*3/uL (ref 0.0–0.1)
Basophils Relative: 0.6 % (ref 0.0–3.0)
Eosinophils Absolute: 0.3 10*3/uL (ref 0.0–0.7)
Eosinophils Relative: 3.2 % (ref 0.0–5.0)
HCT: 43.1 % (ref 36.0–46.0)
Hemoglobin: 14.7 g/dL (ref 12.0–15.0)
Lymphocytes Relative: 28.5 % (ref 12.0–46.0)
Lymphs Abs: 2.8 10*3/uL (ref 0.7–4.0)
MCHC: 34.2 g/dL (ref 30.0–36.0)
MCV: 87.5 fl (ref 78.0–100.0)
Monocytes Absolute: 0.6 10*3/uL (ref 0.1–1.0)
Monocytes Relative: 6.2 % (ref 3.0–12.0)
Neutro Abs: 6 10*3/uL (ref 1.4–7.7)
Neutrophils Relative %: 61.5 % (ref 43.0–77.0)
Platelets: 279 10*3/uL (ref 150.0–400.0)
RBC: 4.93 Mil/uL (ref 3.87–5.11)
RDW: 13 % (ref 11.5–15.5)
WBC: 9.7 10*3/uL (ref 4.0–10.5)

## 2020-05-15 LAB — URINE CULTURE
MICRO NUMBER:: 11252002
Result:: NO GROWTH
SPECIMEN QUALITY:: ADEQUATE

## 2020-06-01 ENCOUNTER — Other Ambulatory Visit: Payer: Self-pay | Admitting: Primary Care

## 2020-06-01 DIAGNOSIS — Z720 Tobacco use: Secondary | ICD-10-CM

## 2020-06-01 NOTE — Telephone Encounter (Signed)
Pharmacy requests refill on: Bupropion 100 mg 12 hr  LAST REFILL: 03/05/2020 (Q-180, R-0) LAST OV: 04/24/2020 NEXT OV: Not Scheduled  PHARMACY: [id number] [place], [place]

## 2020-06-01 NOTE — Telephone Encounter (Signed)
How is she doing on the bupropion for tobacco abuse? Is it helping to reduce cravings and cigarette use?

## 2020-06-04 NOTE — Telephone Encounter (Signed)
Message sent thru Mychart for pt to update [place] on hw medication is working.

## 2020-06-04 NOTE — Telephone Encounter (Signed)
Waiting on response from pt 

## 2020-06-04 NOTE — Telephone Encounter (Signed)
Joellen, can you call her?

## 2020-06-04 NOTE — Telephone Encounter (Signed)
MYChart message sent and waiting on a response for see hw Rx is doing

## 2020-06-07 NOTE — Telephone Encounter (Signed)
Called patient states that it was helping she "fell off the wagon" and had quit taking but would like to restart at this time.

## 2020-06-07 NOTE — Telephone Encounter (Signed)
Noted, refill provided 

## 2020-06-20 ENCOUNTER — Ambulatory Visit: Payer: 59 | Admitting: Dermatology

## 2020-10-27 ENCOUNTER — Encounter: Payer: Self-pay | Admitting: Intensive Care

## 2020-10-27 ENCOUNTER — Emergency Department
Admission: EM | Admit: 2020-10-27 | Discharge: 2020-10-27 | Disposition: A | Discharge status: Home / Self Care | Payer: 59 | Attending: Emergency Medicine | Admitting: Emergency Medicine

## 2020-10-27 ENCOUNTER — Other Ambulatory Visit: Payer: Self-pay

## 2020-10-27 DIAGNOSIS — F17210 Nicotine dependence, cigarettes, uncomplicated: Secondary | ICD-10-CM | POA: Insufficient documentation

## 2020-10-27 DIAGNOSIS — Z91040 Latex allergy status: Secondary | ICD-10-CM | POA: Insufficient documentation

## 2020-10-27 DIAGNOSIS — K047 Periapical abscess without sinus: Secondary | ICD-10-CM | POA: Insufficient documentation

## 2020-10-27 HISTORY — DX: Other intervertebral disc degeneration, lumbar region: M51.36

## 2020-10-27 HISTORY — DX: Other intervertebral disc degeneration, lumbar region without mention of lumbar back pain or lower extremity pain: M51.369

## 2020-10-27 MED ORDER — CLINDAMYCIN PHOSPHATE 300 MG/2ML IJ SOLN
300.0000 mg | Freq: Once | INTRAMUSCULAR | Status: AC
  Administered 2020-10-27: 300 mg via INTRAMUSCULAR
  Filled 2020-10-27: qty 2

## 2020-10-27 MED ORDER — CLINDAMYCIN PHOSPHATE 600 MG/4ML IJ SOLN: 600.0000 mg | Freq: Once | INTRAMUSCULAR | Status: DC

## 2020-10-27 MED ORDER — CLINDAMYCIN HCL 300 MG PO CAPS
300.0000 mg | ORAL_CAPSULE | Freq: Three times a day (TID) | ORAL | 0 refills | Status: AC
Start: 2020-10-27 — End: 2020-11-03

## 2020-10-27 MED ORDER — CLINDAMYCIN PHOSPHATE 600 MG/4ML IJ SOLN
600.0000 mg | Freq: Once | INTRAMUSCULAR | Status: DC
  Filled 2020-10-27: qty 4

## 2020-10-27 NOTE — ED Provider Notes (Signed)
[facility] Emergency Department Provider Note  ____________________________________________   Event Date/Time   First MD Initiated Contact with Patient 10/27/20 1329     (approximate)  I have reviewed the triage vital signs and the nursing notes.   HISTORY  Chief Complaint Facial Swelling and Dental Pain   HPI Julia Mckay is a 35 y.o. female presents to the ED with complaint of right-sided facial swelling that was noticed this morning when she woke up.  Patient states that she was prescribed amoxicillin for dental pain at the end of April and took it for 8 days until it upset her stomach.  Patient denies any fever, chills, nausea or vomiting at this time.       Past Medical History:  Diagnosis Date  . ADHD   . Ankylosing spondylitis (HCC)   . Arthritis   . Chronic foot pain   . DDD (degenerative disc disease), lumbar   . Degenerative disc disease, lumbar   . HERPES ZOSTER, UNCOMPLICATED 11/10/2006   Qualifier: Diagnosis of  By: Ermalene Searing MD, Amy    . MIGRAINE HEADACHE 01/12/2007   Qualifier: History of  By: Lindwood Qua CMA, Jerl Santos    . Tobacco abuse   . UTI (urinary tract infection)     Patient Active Problem List   Diagnosis Date Noted  . Hematuria 04/24/2020  . Rash and nonspecific skin eruption 12/15/2019  . Chronic midline low back pain with sciatica 09/28/2019  . Tobacco abuse 09/28/2019  . Chronic foot pain 09/28/2019  . Family history of early CAD 09/28/2019  . Arthralgia of multiple sites, bilateral 09/28/2019  . DEPRESSION 01/12/2007  . Attention deficit hyperactivity disorder (ADHD) 01/12/2007    Past Surgical History:  Procedure Laterality Date  . CLOSED REDUCTION METACARPAL WITH PERCUTANEOUS PINNING Right 01/16/2016   Procedure: CLOSED REDUCTION METACARPAL WITH PERCUTANEOUS PINNING 1 and  2 METACARPAL FRACTURES OF RIGHT HAND;  Surgeon: Christena Flake, MD;  Location: [facility];  Service: Orthopedics;  Laterality:  Right;  . ORIF ANKLE FRACTURE Right 01/16/2016   Procedure: OPEN REDUCTION INTERNAL FIXATION (ORIF) ANKLE FRACTURE;  Surgeon: Christena Flake, MD;  Location: [facility];  Service: Orthopedics;  Laterality: Right;  Latex allergy  . RADIOFREQUENCY ABLATION NERVES     lumbar region of back  . TONSILLECTOMY      Prior to Admission medications   Medication Sig Start Date End Date Taking? Authorizing Provider  clindamycin (CLEOCIN) 300 MG capsule Take 1 capsule (300 mg total) by mouth 3 (three) times daily for 7 days. 10/27/20 11/03/20 Yes Tommi Rumps, PA-C  acetaminophen (TYLENOL) 500 MG tablet Take 500 mg by mouth every 6 (six) hours as needed.    [provider]  buPROPion (WELLBUTRIN SR) 100 MG 12 hr tablet TAKE 1 TABLET(100 MG) BY MOUTH TWICE DAILY 06/07/20   Doreene Nest, NP  ibuprofen (ADVIL) 200 MG tablet Take by mouth every 6 (six) hours as needed.    [provider]  ketoconazole (NIZORAL) 2 % shampoo Shampoo onto body let sit 5 minutes then wash off. Use QD. 02/13/20   Deirdre Evener, MD    Allergies Drospirenone-ethinyl estradiol and Latex  Family History  Problem Relation Age of Onset  . Diabetes Mother   . Hypertension Mother   . Heart attack Father   . Early death Father        32  . Hypertension Father     Social History Social History  Tobacco Use  . Smoking status: Current Every Day Smoker    Packs/day: 1.00    Years: 13.00    Pack years: 13.00    Types: Cigarettes  . Smokeless tobacco: Never Used  Substance Use Topics  . Alcohol use: No  . Drug use: No    Review of Systems Constitutional: No fever/chills Eyes: No visual changes. ENT: No sore throat.  Positive dental pain, facial edema. Cardiovascular: Denies chest pain. Respiratory: Denies shortness of breath. Gastrointestinal: No abdominal pain.  No nausea, no vomiting. Musculoskeletal: Negative for musculoskeletal pain. Skin: Negative for rash. Neurological:  Negative for headaches, focal weakness or numbness.  ____________________________________________   PHYSICAL EXAM:  VITAL SIGNS: ED Triage Vitals [10/27/20 1222]  Enc Vitals Group     BP (!) 123/92     Pulse Rate 91     Resp 16     Temp 98.4 F (36.9 C)     Temp Source Oral     SpO2 97 %     Weight 210 lb (95.3 kg)     Height 5\' 6"  (1.676 m)     Head Circumference      Peak Flow      Pain Score 0     Pain Loc      Pain Edu?      Excl. in GC?     Constitutional: Alert and oriented. Well appearing and in no acute distress. Eyes: Conjunctivae are normal. PERRL. EOMI. Head: Atraumatic. Nose: No congestion/rhinnorhea. Mouth/Throat: Mucous membranes are moist.  Oropharynx non-erythematous.  Left upper premolar and molars without active drainage.  There is edema noted on the lateral aspect of the gum with moderate tenderness.  Also the right cheek area is edematous. Neck: No stridor.   Cardiovascular: Normal rate, regular rhythm. Grossly normal heart sounds.  Good peripheral circulation. Respiratory: Normal respiratory effort.  No retractions. Lungs CTAB. Musculoskeletal: Moves upper and lower extremities any difficulty.  Normal gait was noted. Neurologic:  Normal speech and language. No gross focal neurologic deficits are appreciated. No gait instability. Skin:  Skin is warm, dry and intact. No rash noted. Psychiatric: Mood and affect are normal. Speech and behavior are normal.  ____________________________________________   LABS (all labs ordered are listed, but only abnormal results are displayed)  Labs Reviewed - No data to display ____________________________________________  PROCEDURES  Procedure(s) performed (including Critical Care):  Procedures   ____________________________________________   INITIAL IMPRESSION / ASSESSMENT AND PLAN / ED COURSE  As part of my medical decision making, I reviewed the following data within the electronic MEDICAL RECORD NUMBER  Notes from prior ED visits and [place] Controlled Substance Database  35 year old female presents to the ED with complaint of right-sided facial swelling.  She was recently treated for a dental abscess and states she had to discontinue taking the antibiotic that was prescribed due to GI upset.  Patient continues to have a dental abscess.  An injection of clindamycin 600 mg was given IM.  A prescription for the same antibiotic was sent to her pharmacy.  Patient was given a list of dental clinics in the area to follow-up with.  ____________________________________________   FINAL CLINICAL IMPRESSION(S) / ED DIAGNOSES  Final diagnoses:  Dental abscess     ED Discharge Orders         Ordered    clindamycin (CLEOCIN) 300 MG capsule  3 times daily        10/27/20 1424          *Please  note:  Chandrika L Anthis was evaluated in Emergency Department on 10/27/2020 for the symptoms described in the history of present illness. She was evaluated in the context of the global COVID-19 pandemic, which necessitated consideration that the patient might be at risk for infection with the SARS-CoV-2 virus that causes COVID-19. Institutional protocols and algorithms that pertain to the evaluation of patients at risk for COVID-19 are in a state of rapid change based on information released by regulatory bodies including the CDC and federal and state organizations. These policies and algorithms were followed during the patient's care in the ED.  Some ED evaluations and interventions may be delayed as a result of limited staffing during and the pandemic.*   Note:  This document was prepared using Dragon voice recognition software and may include unintentional dictation errors.    Tommi Rumps, PA-C 10/27/20 1542    Jene Every, MD 10/27/20 [phone]

## 2020-10-27 NOTE — ED Triage Notes (Signed)
Patient presents with swelling to right side of face that started yesterday. Prescribed amoxicillin 10/13/20 for dental pain/chipped tooth on right side of face. Patient reports taking antibiotic for a few days and then stopping and then taking a few more pills. Last taken amoxicillin on Wednesday 10/24/20.

## 2020-10-27 NOTE — Discharge Instructions (Signed)
Call make an appointment with one of the dental clinics listed on your discharge papers.  Begin taking the antibiotic as directed and do not stop until they are completely finished.  You may take Tylenol or ibuprofen as needed for pain.  Also note that the dental clinic at [facility] takes walk-in patients.  OPTIONS FOR DENTAL FOLLOW UP CARE  [place] Department of Health and Human Services - Local Safety Net Dental Clinics [email or web address]   [facility] [phone])  [facility] [phone])  [place] [phone])  [facility] [phone])  [facility] [phone]) This clinic caters to the indigent population and is on a lottery system. Location: [employer] [facility], [employer], [address], [place] Clinic Hours: Wednesdays from 6pm - 9pm, patients seen by a lottery system. For dates, call or go to [email or web address] Services: Cleanings, fillings and simple extractions. Payment Options: DENTAL WORK IS FREE OF CHARGE. Bring proof of income or support. Best way to get seen: Arrive at 5:15 pm - this is a lottery, NOT first come/first serve, so arriving earlier will not increase your chances of being seen.     [facility] [phone] Select option 1 for emergencies   Location: [facility], [place], [address] [place] Clinic Hours: No walk-ins accepted - call the day before to schedule an appointment. Check in times are 9:30 am and 1:30 pm. Services: Simple extractions, temporary fillings, pulpectomy/pulp debridement, uncomplicated abscess drainage. Payment Options: PAYMENT IS DUE AT THE TIME OF SERVICE.  Fee is usually $100-200, additional surgical procedures (e.g. abscess drainage) may be extra. Cash, checks, Visa/MasterCard accepted.  Can file Medicaid if  patient is covered for dental - patient should call case worker to check. No discount for [facility] patients. Best way to get seen: MUST call the day before and get onto the schedule. Can usually be seen the next 1-2 days. No walk-ins accepted.     [facility] [phone]   Location: [facility], [address], [place] Clinic Hours: M, W, Th, F 8am or 1:30pm, Tues 9a or 1:30 - first come/first served. Services: Simple extractions, temporary fillings, uncomplicated abscess drainage.  You do not need to be an [facility] resident. Payment Options: PAYMENT IS DUE AT THE TIME OF SERVICE. Dental insurance, otherwise sliding scale - bring proof of income or support. Depending on income and treatment needed, cost is usually $50-200. Best way to get seen: Arrive early as it is first come/first served.     [facility] [phone]   Location: [address] Clinic Hours: Mon-Thu 8a-5p Services: Most basic dental services including extractions and fillings. Payment Options: PAYMENT IS DUE AT THE TIME OF SERVICE. Sliding scale, up to 50% off - bring proof if income or support. Medicaid with dental option accepted. Best way to get seen: Call to schedule an appointment, can usually be seen within 2 weeks OR they will try to see walk-ins - show up at 8a or 2p (you may have to wait).     [facility] [phone] [place] RESIDENTS ONLY   Location: [facility], [address], [place], [place] [address] Clinic Hours: By appointment only. Monday - Thursday 8am-5pm, Friday 8am-12pm Services: Cleanings, fillings, extractions. Payment Options: PAYMENT IS DUE AT THE TIME OF SERVICE. Cash, Visa or MasterCard. Sliding scale - $30 minimum per service. Best  way to get seen: Come in to office, complete packet and make an appointment - need proof of income or  support monies for each household member and proof of [facility] residence. Usually takes about a month to get in.     [facility] [phone]   Location: [address] [place] Clinic Hours: Walk-in Urgent Care Dental Services are offered Monday-Friday mornings only. The numbers of emergencies accepted daily is limited to the number of providers available. Maximum 15 - Mondays, Wednesdays & Thursdays Maximum 10 - Tuesdays & Fridays Services: You do not need to be a [facility] resident to be seen for a dental emergency. Emergencies are defined as pain, swelling, abnormal bleeding, or dental trauma. Walkins will receive x-rays if needed. NOTE: Dental cleaning is not an emergency. Payment Options: PAYMENT IS DUE AT THE TIME OF SERVICE. Minimum co-pay is $40.00 for uninsured patients. Minimum co-pay is $3.00 for Medicaid with dental coverage. Dental Insurance is accepted and must be presented at time of visit. Medicare does not cover dental. Forms of payment: Cash, credit card, checks. Best way to get seen: If not previously registered with the clinic, walk-in dental registration begins at 7:15 am and is on a first come/first serve basis. If previously registered with the clinic, call to make an appointment.     [facility] [place] ONLY   Location: [address] [place], [place] Clinic Hours: Mon-Thu 10a-2p Services: Extractions only! Payment Options: FREE (donations accepted) - bring proof of income or support Best way to get seen: Call and schedule an appointment OR come at 8am on the 1st Monday of every month (except for holidays) when it is first come/first served.     [facility] [phone]   Location: [place], [place] Clinic Hours: Friday mornings Services, Payment Options, Best way to get seen: Call for info

## 2023-06-17 DIAGNOSIS — Z419 Encounter for procedure for purposes other than remedying health state, unspecified: Secondary | ICD-10-CM | POA: Diagnosis not present

## 2023-07-18 DIAGNOSIS — Z419 Encounter for procedure for purposes other than remedying health state, unspecified: Secondary | ICD-10-CM | POA: Diagnosis not present

## 2023-08-15 DIAGNOSIS — Z419 Encounter for procedure for purposes other than remedying health state, unspecified: Secondary | ICD-10-CM | POA: Diagnosis not present

## 2023-09-26 DIAGNOSIS — Z419 Encounter for procedure for purposes other than remedying health state, unspecified: Secondary | ICD-10-CM | POA: Diagnosis not present

## 2023-10-26 DIAGNOSIS — Z419 Encounter for procedure for purposes other than remedying health state, unspecified: Secondary | ICD-10-CM | POA: Diagnosis not present

## 2023-11-26 DIAGNOSIS — Z419 Encounter for procedure for purposes other than remedying health state, unspecified: Secondary | ICD-10-CM | POA: Diagnosis not present

## 2023-12-26 DIAGNOSIS — Z419 Encounter for procedure for purposes other than remedying health state, unspecified: Secondary | ICD-10-CM | POA: Diagnosis not present

## 2024-01-26 DIAGNOSIS — Z419 Encounter for procedure for purposes other than remedying health state, unspecified: Secondary | ICD-10-CM | POA: Diagnosis not present

## 2024-02-26 DIAGNOSIS — Z419 Encounter for procedure for purposes other than remedying health state, unspecified: Secondary | ICD-10-CM | POA: Diagnosis not present
